# Patient Record
Sex: Female | Born: 1990 | Race: Black or African American | Hispanic: No | Marital: Married | Smoking: Never smoker
Health system: Southern US, Community
[De-identification: ages and names within clinical notes are randomized; demographics above are authoritative.]

## PROBLEM LIST (undated history)

## (undated) DIAGNOSIS — Z789 Other specified health status: Secondary | ICD-10-CM

## (undated) DIAGNOSIS — D696 Thrombocytopenia, unspecified: Secondary | ICD-10-CM

## (undated) DIAGNOSIS — O99119 Other diseases of the blood and blood-forming organs and certain disorders involving the immune mechanism complicating pregnancy, unspecified trimester: Secondary | ICD-10-CM

## (undated) DIAGNOSIS — K831 Obstruction of bile duct: Secondary | ICD-10-CM

## (undated) HISTORY — PX: NO PAST SURGERIES: SHX2092

## (undated) HISTORY — DX: Obstruction of bile duct: K83.1

## (undated) HISTORY — DX: Thrombocytopenia, unspecified: O99.119

## (undated) HISTORY — PX: WISDOM TOOTH EXTRACTION: SHX21

## (undated) HISTORY — DX: Thrombocytopenia, unspecified: D69.6

## (undated) HISTORY — DX: Other specified health status: Z78.9

---

## 2018-06-13 NOTE — L&D Delivery Note (Addendum)
OB/GYN Faculty Practice Delivery Note  Dorothy Olsen is a 28 y.o. G1P0 s/p induced vaginal delivery at [redacted]w[redacted]d. She was admitted for IOL 2/2 ICP.   ROM: 4h 110m with clear fluid GBS Status: negative Maximum Maternal Temperature: 98.8  Labor Progress: . After a short second stage, the patient delivered a female infant under epidural anesthesia at 1701 on 01/18/2019.  Delivery Date/Time: 01/18/2019 at 1707 hours Delivery: Called to room and patient was actively pushing. Head delivered ROA and restituted LOT. There was one nuchal, tight, which was reduced. Shoulder and body delivered in usual fashion. Infant with spontaneous cry, placed on mother's abdomen, dried and stimulated. Cord clamped x 2 after 1-minute delay, and cut by father of the baby. Cord blood drawn. Placenta delivered spontaneously with gentle cord traction via Duncan mechanism. Fundus firm with massage and Pitocin.   There was a 2nd degree perineal laceration which was repaired with 3-0 Rapide in the usual fashion.  Placenta: intact Complications: none Lacerations: 2nd degree, repaired with 3-0 Rapide in the usual fashion EBL: 115 Analgesia: epidural  Postpartum Planning [x]  message to sent to schedule follow-up  [x]  vaccines UTD  Infant: female, APGARS 70 & 4, [place], DO OB/GYN Fellow, Faculty Practice    I confirm that I have verified the information documented in the reperformed  student's note and that I have also personally reperformed the history, physical exam and all medical decision making activities of this service and have verified that all service and findings are accurately documented in this student's note.     [place], [place] 01/26/2019 10:41 AM

## 2018-07-02 ENCOUNTER — Encounter: Payer: Self-pay | Admitting: General Practice

## 2018-07-02 ENCOUNTER — Ambulatory Visit (INDEPENDENT_AMBULATORY_CARE_PROVIDER_SITE_OTHER): Payer: 59 | Admitting: *Deleted

## 2018-07-02 ENCOUNTER — Other Ambulatory Visit: Payer: Self-pay

## 2018-07-02 VITALS — BP 120/74 | HR 90 | Temp 98.7°F | Ht 66.0 in | Wt 170.6 lb

## 2018-07-02 DIAGNOSIS — Z3201 Encounter for pregnancy test, result positive: Secondary | ICD-10-CM | POA: Diagnosis not present

## 2018-07-02 DIAGNOSIS — Z3200 Encounter for pregnancy test, result unknown: Secondary | ICD-10-CM

## 2018-07-02 LAB — POCT URINE PREGNANCY: Preg Test, Ur: POSITIVE — AB

## 2018-07-02 NOTE — Progress Notes (Signed)
   Ms. Senters presents today for UPT. She has no unusual complaints and complains of nausea with vomiting for 15 days. Requesting medication for nausea and vomiting.  LMP:  05/03/2018    OBJECTIVE: Appears well, in no apparent distress.  OB History    Gravida  1   Para      Term      Preterm      AB      Living        SAB      TAB      Ectopic      Multiple      Live Births             Home UPT Result: Positive In-Office UPT result: Positive I have reviewed the patient's medical, obstetrical, social, and family histories, and medications.   ASSESSMENT: Positive pregnancy test  PLAN Prenatal care to be completed at: [facility]. Start PNV.

## 2018-07-18 ENCOUNTER — Other Ambulatory Visit: Payer: Self-pay

## 2018-07-18 ENCOUNTER — Ambulatory Visit (INDEPENDENT_AMBULATORY_CARE_PROVIDER_SITE_OTHER): Payer: 59 | Admitting: *Deleted

## 2018-07-18 DIAGNOSIS — Z3400 Encounter for supervision of normal first pregnancy, unspecified trimester: Secondary | ICD-10-CM | POA: Diagnosis not present

## 2018-07-18 LAB — POCT URINALYSIS DIPSTICK OB
Bilirubin, UA: NEGATIVE
Glucose, UA: NEGATIVE
Ketones, UA: NEGATIVE
Leukocytes, UA: NEGATIVE
Nitrite, UA: NEGATIVE
PH UA: 7 (ref 5.0–8.0)
RBC UA: NEGATIVE
Spec Grav, UA: 1.02 (ref 1.010–1.025)
Urobilinogen, UA: 0.2 E.U./dL

## 2018-07-18 NOTE — Patient Instructions (Addendum)
Genetic Screening Results Information: You are having genetic testing called [facility] today.  It will take approximately 2 weeks before the results are available.  To get your results, you need Internet access to a web browser to search [place]/MyChart (the direct app on your phone will not give you these results).  Then select Lab Scanned and click on the blue hyper link that says View Image to see your Panorama results.  You can also use the directions on the purple card given to look up your results directly on the [facility] website.   First Trimester of Pregnancy  The first trimester of pregnancy is from week 1 until the end of week 13 (months 1 through 3). During this time, your baby will begin to develop inside you. At 6-8 weeks, the eyes and face are formed, and the heartbeat can be seen on ultrasound. At the end of 12 weeks, all the baby's organs are formed. Prenatal care is all the medical care you receive before the birth of your baby. Make sure you get good prenatal care and follow all of your doctor's instructions. Follow these instructions at home: Medicines  Take over-the-counter and prescription medicines only as told by your doctor. Some medicines are safe and some medicines are not safe during pregnancy.  Take a prenatal vitamin that contains at least 600 micrograms (mcg) of folic acid.  If you have trouble pooping (constipation), take medicine that will make your stool soft (stool softener) if your doctor approves. Eating and drinking   Eat regular, healthy meals.  Your doctor will tell you the amount of weight gain that is right for you.  Avoid raw meat and uncooked cheese.  If you feel sick to your stomach (nauseous) or throw up (vomit): ? Eat 4 or 5 small meals a day instead of 3 large meals. ? Try eating a few soda crackers. ? Drink liquids between meals instead of during meals.  To prevent constipation: ? Eat foods that are high in fiber, like fresh fruits and  vegetables, whole grains, and beans. ? Drink enough fluids to keep your pee (urine) clear or pale yellow. Activity  Exercise only as told by your doctor. Stop exercising if you have cramps or pain in your lower belly (abdomen) or low back.  Do not exercise if it is too hot, too humid, or if you are in a place of great height (high altitude).  Try to avoid standing for long periods of time. Move your legs often if you must stand in one place for a long time.  Avoid heavy lifting.  Wear low-heeled shoes. Sit and stand up straight.  You can have sex unless your doctor tells you not to. Relieving pain and discomfort  Wear a good support bra if your breasts are sore.  Take warm water baths (sitz baths) to soothe pain or discomfort caused by hemorrhoids. Use hemorrhoid cream if your doctor says it is okay.  Rest with your legs raised if you have leg cramps or low back pain.  If you have puffy, bulging veins (varicose veins) in your legs: ? Wear support hose or compression stockings as told by your doctor. ? Raise (elevate) your feet for 15 minutes, 3-4 times a day. ? Limit salt in your food. Prenatal care  Schedule your prenatal visits by the twelfth week of pregnancy.  Write down your questions. Take them to your prenatal visits.  Keep all your prenatal visits as told by your doctor. This is important.  Safety  Wear your seat belt at all times when driving.  Make a list of emergency phone numbers. The list should include numbers for family, friends, the hospital, and police and fire departments. General instructions  Ask your doctor for a referral to a local prenatal class. Begin classes no later than at the start of month 6 of your pregnancy.  Ask for help if you need counseling or if you need help with nutrition. Your doctor can give you advice or tell you where to go for help.  Do not use hot tubs, steam rooms, or saunas.  Do not douche or use tampons or scented sanitary  pads.  Do not cross your legs for long periods of time.  Avoid all herbs and alcohol. Avoid drugs that are not approved by your doctor.  Do not use any tobacco products, including cigarettes, chewing tobacco, and electronic cigarettes. If you need help quitting, ask your doctor. You may get counseling or other support to help you quit.  Avoid cat litter boxes and soil used by cats. These carry germs that can cause birth defects in the baby and can cause a loss of your baby (miscarriage) or stillbirth.  Visit your dentist. At home, brush your teeth with a soft toothbrush. Be gentle when you floss. Contact a doctor if:  You are dizzy.  You have mild cramps or pressure in your lower belly.  You have a nagging pain in your belly area.  You continue to feel sick to your stomach, you throw up, or you have watery poop (diarrhea).  You have a bad smelling fluid coming from your vagina.  You have pain when you pee (urinate).  You have increased puffiness (swelling) in your face, hands, legs, or ankles. Get help right away if:  You have a fever.  You are leaking fluid from your vagina.  You have spotting or bleeding from your vagina.  You have very bad belly cramping or pain.  You gain or lose weight rapidly.  You throw up blood. It may look like coffee grounds.  You are around people who have [place] measles, fifth disease, or chickenpox.  You have a very bad headache.  You have shortness of breath.  You have any kind of trauma, such as from a fall or a car accident. Summary  The first trimester of pregnancy is from week 1 until the end of week 13 (months 1 through 3).  To take care of yourself and your unborn baby, you will need to eat healthy meals, take medicines only if your doctor tells you to do so, and do activities that are safe for you and your baby.  Keep all follow-up visits as told by your doctor. This is important as your doctor will have to ensure that your  baby is healthy and growing well. This information is not intended to replace advice given to you by your health care provider. Make sure you discuss any questions you have with your health care provider. Document Released: 11/16/2007 Document Revised: 06/07/2016 Document Reviewed: 06/07/2016 Elsevier Interactive Patient Education  2019 [employer].  Pregnancy and Sex Your sex life may change during pregnancy as well as after your newborn arrives. It is normal to have questions about sex during pregnancy. All women are affected differently by pregnancy hormones. You may notice an increase or decrease in your sexual drive throughout your pregnancy. Also, your partner's attitude and sexual drive may change. Share the information in this document with your partner.  Talk openly about how you feel about sex. When is it safe to have sex during pregnancy? Sex is generally considered safe throughout a normal low-risk pregnancy. Remember:  The fetus is protected by the uterus and the fluid-filled sac that surrounds the fetus (amniotic sac).  The cervix is closed or sealed during pregnancy.  The penis does not reach or harm the fetus during sex.  Sex and orgasms are not thought to cause miscarriages or early labor.  If you use lubricants, use a water-soluble product. What risk factors make it unsafe to have sex while pregnant? The following complications or risk factors may make it necessary to limit sexual activity:  You have a history of miscarriage or preterm labor.  You have bleeding, discharge, fluid leakage, or contractions.  Your placenta may be partially covering or completely covering the opening to the cervix (placenta previa).  Your cervix is weak and opens easily (incompetent cervix).  Your partner has an STD (sexually transmitted disease). Avoid sex with the infected person or use a condom to prevent infection to the fetus.  You are unsure of your partner's sexual history. Avoid  sex or use condoms.  You are having twins, triples, or other multiples. Your health care provider will help you determine whether sex during your pregnancy is safe. What practices are unsafe?  If you engage in oral sex, you should avoid having your partner blow air into your vagina. Although very rare, this can send a dangerous air bubble into your bloodstream.  Anal sex is generally safe during pregnancy, but there can be a risk of spreading bacteria from the rectum and aggravating any hemorrhoids. This information is not intended to replace advice given to you by your health care provider. Make sure you discuss any questions you have with your health care provider. Document Released: 11/17/2009 Document Revised: 01/26/2016 Document Reviewed: 11/19/2015 Elsevier Interactive Patient Education  2019 [employer]  Preterm Labor and Birth Information Pregnancy normally lasts 39-41 weeks. Preterm labor is when labor starts early. It starts before you have been pregnant for 37 whole weeks. What are the risk factors for preterm labor? Preterm labor is more likely to occur in women who:  Have an infection while pregnant.  Have a cervix that is short.  Have gone into preterm labor before.  Have had surgery on their cervix.  Are younger than age 29.  Are older than age 29.  Are African American.  Are pregnant with two or more babies.  Take street drugs while pregnant.  Smoke while pregnant.  Do not gain enough weight while pregnant.  Got pregnant right after another pregnancy. What are the symptoms of preterm labor? Symptoms of preterm labor include:  Cramps. The cramps may feel like the cramps some women get during their period. The cramps may happen with watery poop (diarrhea).  Pain in the belly (abdomen).  Pain in the lower back.  Regular contractions or tightening. It may feel like your belly is getting tighter.  Pressure in the lower belly that seems to get  stronger.  More fluid (discharge) leaking from the vagina. The fluid may be watery or bloody.  Water breaking. Why is it important to notice signs of preterm labor? Babies who are born early may not be fully developed. They have a higher chance for:  Long-term heart problems.  Long-term lung problems.  Trouble controlling body systems, like breathing.  Bleeding in the brain.  A condition called cerebral palsy.  Learning difficulties.  Death.  These risks are highest for babies who are born before 34 weeks of pregnancy. How is preterm labor treated? Treatment depends on:  How long you were pregnant.  Your condition.  The health of your baby. Treatment may involve:  Having a stitch (suture) placed in your cervix. When you give birth, your cervix opens so the baby can come out. The stitch keeps the cervix from opening too soon.  Staying at the hospital.  Taking or getting medicines, such as: ? Hormone medicines. ? Medicines to stop contractions. ? Medicines to help the baby's lungs develop. ? Medicines to prevent your baby from having cerebral palsy. What should I do if I am in preterm labor? If you think you are going into labor too soon, call your doctor right away. How can I prevent preterm labor?  Do not use any tobacco products. ? Examples of these are cigarettes, chewing tobacco, and e-cigarettes. ? If you need help quitting, ask your doctor.  Do not use street drugs.  Do not use any medicines unless you ask your doctor if they are safe for you.  Talk with your doctor before taking any herbal supplements.  Make sure you gain enough weight.  Watch for infection. If you think you might have an infection, get it checked right away.  If you have gone into preterm labor before, tell your doctor. This information is not intended to replace advice given to you by your health care provider. Make sure you discuss any questions you have with your health care  provider. Document Released: 08/26/2008 Document Revised: 11/10/2015 Document Reviewed: 10/21/2015 Elsevier Interactive Patient Education  2019 [employer]  Warning Signs During Pregnancy A pregnancy lasts about 40 weeks, starting from the first day of your last period until the baby is born. Pregnancy is divided into three phases called trimesters.  The first trimester refers to week 1 through week 13 of pregnancy.  The second trimester is the start of week 14 through the end of week 27.  The third trimester is the start of week 28 until you deliver your baby. During each trimester of pregnancy, certain signs and symptoms may indicate a problem. Talk with your health care provider about your current health and any medical conditions you have. Make sure you know the symptoms that you should watch for and report. How does this affect me?  Warning signs in the first trimester While some changes during the first trimester may be uncomfortable, most do not represent a serious problem. Let your health care provider know if you have any of the following warning signs in the first trimester:  You cannot eat or drink without vomiting, and this lasts for longer than a day.  You have vaginal bleeding or spotting along with menstrual-like cramping.  You have diarrhea for longer than a day.  You have a fever or other signs of infection, such as: ? Pain or burning when you urinate. ? Foul smelling or thick or yellowish vaginal discharge. Warning signs in the second trimester As your baby grows and changes during the second trimester, there are additional signs and symptoms that may indicate a problem. These include:  Signs and symptoms of infection, including a fever.  Signs or symptoms of a miscarriage or preterm labor, such as regular contractions, menstrual-like cramping, or lower abdominal pain.  Bloody or watery vaginal discharge or obvious vaginal bleeding.  Feeling like your heart is  pounding.  Having trouble breathing.  Nausea, vomiting, or diarrhea that lasts  for longer than a day.  Craving non-food items, such as clay, chalk, or dirt. This may be a sign of a very treatable medical condition called pica. Later in your second trimester, watch for signs and symptoms of a serious medical condition called preeclampsia.These include:  Changes in your vision.  A severe headache that does not go away.  Nausea and vomiting. It is also important to notice if your baby stops moving or moves less than usual during this time. Warning signs in the third trimester As you approach the third trimester, your baby is growing and your body is preparing for the birth of your baby. In your third trimester, be sure to let your health care provider know if:  You have signs and symptoms of infection, including a fever.  You have vaginal bleeding.  You notice that your baby is moving less than usual or is not moving.  You have nausea, vomiting, or diarrhea that lasts for longer than a day.  You have a severe headache that does not go away.  You have vision changes, including seeing spots or having blurry or double vision.  You have increased swelling in your hands or face. How does this affect my baby? Throughout your pregnancy, always report any of the warning signs of a problem to your health care provider. This can help prevent complications that may affect your baby, including:  Increased risk for premature birth.  Infection that may be transmitted to your baby.  Increased risk for stillbirth. Contact a health care provider if:  You have any of the warning signs of a problem for the current trimester of your pregnancy.  Any of the following apply to you during any trimester of pregnancy: ? You have strong emotions, such as sadness or anxiety, that interfere with work or personal relationships. ? You feel unsafe in your home and need help finding a safe place to  live. ? You are using tobacco products, alcohol, or drugs and you need help to stop. Get help right away if: You have signs or symptoms of labor before 37 weeks of pregnancy. These include:  Contractions that are 5 minutes or less apart, or that increase in frequency, intensity, or length.  Sudden, sharp abdominal pain or low back pain.  Uncontrolled gush or trickle of fluid from your vagina. Summary  A pregnancy lasts about 40 weeks, starting from the first day of your last period until the baby is born. Pregnancy is divided into three phases called trimesters. Each trimester has warning signs to watch for.  Always report any warning signs to your health care provider in order to prevent complications that may affect both you and your baby.  Talk with your health care provider about your current health and any medical conditions you have. Make sure you know the symptoms that you should watch for and report. This information is not intended to replace advice given to you by your health care provider. Make sure you discuss any questions you have with your health care provider. Document Released: 03/16/2017 Document Revised: 03/16/2017 Document Reviewed: 03/16/2017 Elsevier Interactive Patient Education  2019 [employer].

## 2018-07-18 NOTE — Progress Notes (Signed)
   PRENATAL INTAKE SUMMARY  Ms. Dorothy Olsen presents today New OB Nurse Interview.  OB History    Gravida  1   Para      Term      Preterm      AB      Living        SAB      TAB      Ectopic      Multiple      Live Births             I have reviewed the patient's medical, obstetrical, social, and family histories, medications, and available lab results.  SUBJECTIVE She has no unusual complaints.  OBJECTIVE Initial nurse interview for history or lab work (New OB).  EDD: 02/07/2019 GA: [id number] G1P0 FHT: 175  GENERAL APPEARANCE: alert, well appearing, in no apparent distress, oriented to person, place and time, well hydrated.   ASSESSMENT Normal pregnancy.  PLAN Prenatal care-[facility] Pnl/HIV  OB Urine Culture/Dip GC/CT/PAP at next visit with midwife HgbEval SMA/CF ([facility]) Panorama A1C Continue PNV Flu shot declined  Genetic Screening Results Information: You are having genetic testing called [facility] today.  It will take approximately 2 weeks before the results are available.  To get your results, you need Internet access to a web browser to search [place]/MyChart (the direct app on your phone will not give you these results).  Then select Lab Scanned and click on the blue hyper link that says View Image to see your [facility]ama results.  You can also use the directions on the purple card given to look up your results directly on the [facility] website.   Clovis PuMartin, Tamika L, RN

## 2018-07-19 ENCOUNTER — Encounter: Payer: Self-pay | Admitting: General Practice

## 2018-07-19 ENCOUNTER — Telehealth: Payer: Self-pay | Admitting: General Practice

## 2018-07-19 NOTE — Telephone Encounter (Signed)
Left message on VM in regards to [place] appt scheduled for 07/25/18 at 3:45pm.  Asked patient to give our office a call for any questions or concerns.

## 2018-07-20 LAB — URINE CULTURE, OB REFLEX

## 2018-07-20 LAB — CULTURE, OB URINE

## 2018-07-23 LAB — HEMOGLOBINOPATHY EVALUATION
Ferritin: 99 ng/mL (ref 15–150)
HGB SOLUBILITY: NEGATIVE
Hgb A2 Quant: 3 % (ref 1.8–3.2)
Hgb A: 97 % (ref 96.4–98.8)
Hgb C: 0 %
Hgb F Quant: 0 % (ref 0.0–2.0)
Hgb S: 0 %
Hgb Variant: 0 %

## 2018-07-23 LAB — OBSTETRIC PANEL, INCLUDING HIV
Antibody Screen: NEGATIVE
Basophils Absolute: 0 10*3/uL (ref 0.0–0.2)
Basos: 0 %
EOS (ABSOLUTE): 0.2 10*3/uL (ref 0.0–0.4)
Eos: 2 %
HEMATOCRIT: 33.3 % — AB (ref 34.0–46.6)
HIV Screen 4th Generation wRfx: NONREACTIVE
Hemoglobin: 10.9 g/dL — ABNORMAL LOW (ref 11.1–15.9)
Hepatitis B Surface Ag: NEGATIVE
Immature Grans (Abs): 0 10*3/uL (ref 0.0–0.1)
Immature Granulocytes: 0 %
LYMPHS: 36 %
Lymphocytes Absolute: 2.3 10*3/uL (ref 0.7–3.1)
MCH: 27.5 pg (ref 26.6–33.0)
MCHC: 32.7 g/dL (ref 31.5–35.7)
MCV: 84 fL (ref 79–97)
Monocytes Absolute: 0.4 10*3/uL (ref 0.1–0.9)
Monocytes: 7 %
Neutrophils Absolute: 3.4 10*3/uL (ref 1.4–7.0)
Neutrophils: 55 %
Platelets: 198 10*3/uL (ref 150–450)
RBC: 3.96 x10E6/uL (ref 3.77–5.28)
RDW: 13.2 % (ref 11.7–15.4)
RPR Ser Ql: NONREACTIVE
Rh Factor: POSITIVE
Rubella Antibodies, IGG: 2.33 index (ref >0.99)
WBC: 6.3 10*3/uL (ref 3.4–10.8)

## 2018-07-23 LAB — HEMOGLOBIN A1C
ESTIMATED AVERAGE GLUCOSE: 97 mg/dL
Hgb A1c MFr Bld: 5 % (ref 4.8–5.6)

## 2018-07-25 ENCOUNTER — Ambulatory Visit (HOSPITAL_COMMUNITY)
Admission: RE | Admit: 2018-07-25 | Discharge: 2018-07-25 | Disposition: A | Discharge status: Home / Self Care | Payer: 59 | Source: Ambulatory Visit | Attending: Obstetrics and Gynecology | Admitting: Obstetrics and Gynecology

## 2018-07-25 ENCOUNTER — Other Ambulatory Visit: Payer: Self-pay | Admitting: Obstetrics and Gynecology

## 2018-07-25 DIAGNOSIS — Z3400 Encounter for supervision of normal first pregnancy, unspecified trimester: Secondary | ICD-10-CM | POA: Insufficient documentation

## 2018-07-30 ENCOUNTER — Encounter: Payer: Self-pay | Admitting: General Practice

## 2018-08-02 ENCOUNTER — Encounter: Payer: 59 | Admitting: Obstetrics and Gynecology

## 2018-08-06 ENCOUNTER — Encounter: Payer: Self-pay | Admitting: General Practice

## 2018-08-16 ENCOUNTER — Other Ambulatory Visit: Payer: Self-pay

## 2018-08-16 ENCOUNTER — Ambulatory Visit (INDEPENDENT_AMBULATORY_CARE_PROVIDER_SITE_OTHER): Payer: 59 | Admitting: Obstetrics and Gynecology

## 2018-08-16 VITALS — BP 130/74 | HR 80 | Wt 169.6 lb

## 2018-08-16 DIAGNOSIS — N898 Other specified noninflammatory disorders of vagina: Secondary | ICD-10-CM | POA: Diagnosis not present

## 2018-08-16 DIAGNOSIS — Z124 Encounter for screening for malignant neoplasm of cervix: Secondary | ICD-10-CM | POA: Diagnosis not present

## 2018-08-16 DIAGNOSIS — Z3400 Encounter for supervision of normal first pregnancy, unspecified trimester: Secondary | ICD-10-CM

## 2018-08-16 DIAGNOSIS — Z3492 Encounter for supervision of normal pregnancy, unspecified, second trimester: Secondary | ICD-10-CM

## 2018-08-16 DIAGNOSIS — Z113 Encounter for screening for infections with a predominantly sexual mode of transmission: Secondary | ICD-10-CM

## 2018-08-16 NOTE — Progress Notes (Signed)
  Subjective:    Dorothy Olsen is being seen today for her first obstetrical visit.  This is a planned pregnancy. She is at [redacted]w[redacted]d gestation. Her obstetrical history is not significant. Relationship with FOB: spouse, living together. Patient does intend to breast feed. Pregnancy history fully reviewed.  Patient reports no complaints.  Review of Systems:   Review of Systems  Constitutional: Positive for appetite change (decreased, getting better). Negative for fatigue and fever.  HENT: Negative.   Eyes: Negative.  Negative for visual disturbance.  Respiratory: Negative.  Negative for shortness of breath.   Cardiovascular: Negative.   Gastrointestinal: Positive for nausea. Negative for abdominal distention, abdominal pain, constipation and diarrhea (occasional).  Endocrine: Negative.   Genitourinary: Negative.  Negative for vaginal bleeding, vaginal discharge and vaginal pain.  Musculoskeletal: Negative.   Allergic/Immunologic: Negative.   Neurological: Negative.  Negative for dizziness and light-headedness.  Hematological: Negative.   Psychiatric/Behavioral: Negative.     Objective:     BP 130/74   Pulse 80   Wt 76.9 kg   LMP 05/03/2018 (Exact Date)   BMI 27.37 kg/m  Physical Exam  Nursing note and vitals reviewed. Constitutional: She is oriented to person, place, and time. She appears well-developed and well-nourished. No distress.  HENT:  Head: Normocephalic.  Eyes: Pupils are equal, round, and reactive to light.  Neck: Normal range of motion.  Cardiovascular: Normal rate, regular rhythm and normal heart sounds.  Respiratory: Effort normal and breath sounds normal.  GI: Soft. She exhibits no distension. There is no abdominal tenderness.  Genitourinary:    Vulva, vagina and uterus normal.   Musculoskeletal: Normal range of motion.  Neurological: She is alert and oriented to person, place, and time.  Skin: Skin is warm and dry. She is not diaphoretic.  Psychiatric: She  has a normal mood and affect. Her behavior is normal. Judgment and thought content normal.    Maternal Exam:  Abdomen: Patient reports no abdominal tenderness. Fundal height is 15cm.    Introitus: Normal vulva. Normal vagina.    Fetal Exam Fetal Monitor Review: Mode: hand-held doppler probe.   Baseline rate: 163.       Assessment & Plan:  Pap smear for cervical cancer screening - Plan: Cytology - PAP( [place])  Supervision of normal first pregnancy, antepartum - Plan: Cytology - PAP( [place]), Cervicovaginal ancillary only( [place]), [place] MFM OB COMP + 14 WK  Initial labs reviewed, [place] test given to FOB for alpha-thalassemia testing. Prenatal vitamins being taken. Problem list reviewed and updated. AFP3 discussed: requested. Role of ultrasound in pregnancy discussed; fetal survey: requested. Amniocentesis discussed: not indicated. Follow up in 4 weeks. Reviewed the nature of [place] [facility] with multiple MDs and other Advanced Practice Providers was explained to patient; also emphasized that residents, students are part of our team. 50% of 40 min visit spent on counseling and coordination of care.    Bernerd Limbo, SNM 08/16/2018

## 2018-08-17 LAB — CERVICOVAGINAL ANCILLARY ONLY
Bacterial vaginitis: NEGATIVE
Candida vaginitis: NEGATIVE
Chlamydia: NEGATIVE
Neisseria Gonorrhea: NEGATIVE
Trichomonas: NEGATIVE

## 2018-08-18 ENCOUNTER — Encounter: Payer: Self-pay | Admitting: Obstetrics and Gynecology

## 2018-08-20 LAB — CYTOLOGY - PAP: Diagnosis: NEGATIVE

## 2018-09-06 ENCOUNTER — Encounter (HOSPITAL_COMMUNITY): Payer: Self-pay

## 2018-09-10 ENCOUNTER — Telehealth: Payer: Self-pay | Admitting: General Practice

## 2018-09-10 NOTE — Telephone Encounter (Signed)
Left message on VM in regards to appt on Thursday, 09/13/2018.  Asked patient to give our office a call.  Mychart message sent to patient.

## 2018-09-13 ENCOUNTER — Other Ambulatory Visit: Payer: Self-pay

## 2018-09-13 ENCOUNTER — Encounter: Payer: Self-pay | Admitting: Obstetrics and Gynecology

## 2018-09-13 ENCOUNTER — Ambulatory Visit (INDEPENDENT_AMBULATORY_CARE_PROVIDER_SITE_OTHER): Payer: 59 | Admitting: Obstetrics and Gynecology

## 2018-09-13 VITALS — BP 138/70 | Wt 168.2 lb

## 2018-09-13 DIAGNOSIS — Z3402 Encounter for supervision of normal first pregnancy, second trimester: Secondary | ICD-10-CM

## 2018-09-13 DIAGNOSIS — Z3400 Encounter for supervision of normal first pregnancy, unspecified trimester: Secondary | ICD-10-CM

## 2018-09-13 DIAGNOSIS — Z3A19 19 weeks gestation of pregnancy: Secondary | ICD-10-CM

## 2018-09-13 NOTE — Progress Notes (Signed)
   TELEHEALTH VIRTUAL OBSTETRICS VISIT ENCOUNTER NOTE  I connected with Dorothy Olsen on 09/13/18 at  4:10 PM EDT by telephone at home and verified that I am speaking with the correct person using two identifiers.   I discussed the limitations, risks, security and privacy concerns of performing an evaluation and management service by telephone and the availability of in person appointments. I also discussed with the patient that there may be a patient responsible charge related to this service. The patient expressed understanding and agreed to proceed.  Subjective:  Dorothy Olsen is a 28 y.o. G1P0 at [redacted]w[redacted]d being followed for ongoing prenatal care.  She is currently monitored for the following issues for this low-risk pregnancy and has Supervision of normal first pregnancy, antepartum on their problem list.  Patient reports headache. Reports fetal movement. Denies any contractions, bleeding or leaking of fluid.    VS taken with patient's own equipment: BP: 138/70 Wt: 168.2 Lbs  The following portions of the patient's history were reviewed and updated as appropriate: allergies, current medications, past family history, past medical history, past social history, past surgical history and problem list.   Objective:   General:  Alert, oriented and cooperative.   Mental Status: Normal mood and affect perceived. Normal judgment and thought content.  Rest of physical exam deferred due to type of encounter  Assessment and Plan:  Pregnancy: G1P0 at [redacted]w[redacted]d 1. Supervision of normal first pregnancy, antepartum - Anatomy U/S tomorrow 09/14/2018 -- will discuss results at [facility] - Anticipatory guidance for 2 hr GTT at [facility]  Preterm labor symptoms and general obstetric precautions including but not limited to vaginal bleeding, contractions, leaking of fluid and fetal movement were reviewed in detail with the patient.  I discussed the assessment and treatment plan with the patient. The patient was provided an  opportunity to ask questions and all were answered. The patient agreed with the plan and demonstrated an understanding of the instructions. The patient was advised to call back or seek an in-person office evaluation/go to [facility]U at [facility] for any urgent or concerning symptoms. Please refer to After Visit Summary for other counseling recommendations.   I provided 10 minutes of non-face-to-face time during this encounter.  Return in about 2 months (around 11/13/2018) for Return OB 2hr GTT.  Future Appointments  Date Time Provider Department Center  09/14/2018  1:45 PM [facility] [place] 2 [facility]    Raelyn Mora, CNM [facility] for [employer], [facility]

## 2018-09-14 ENCOUNTER — Other Ambulatory Visit: Payer: Self-pay

## 2018-09-14 ENCOUNTER — Ambulatory Visit (HOSPITAL_COMMUNITY)
Admission: RE | Admit: 2018-09-14 | Discharge: 2018-09-14 | Disposition: A | Discharge status: Home / Self Care | Payer: 59 | Source: Ambulatory Visit | Attending: Obstetrics and Gynecology | Admitting: Obstetrics and Gynecology

## 2018-09-14 ENCOUNTER — Other Ambulatory Visit (HOSPITAL_COMMUNITY): Payer: Self-pay | Admitting: *Deleted

## 2018-09-14 DIAGNOSIS — Z363 Encounter for antenatal screening for malformations: Secondary | ICD-10-CM | POA: Diagnosis not present

## 2018-09-14 DIAGNOSIS — Z3A19 19 weeks gestation of pregnancy: Secondary | ICD-10-CM

## 2018-09-14 DIAGNOSIS — Z148 Genetic carrier of other disease: Secondary | ICD-10-CM

## 2018-09-14 DIAGNOSIS — Z3400 Encounter for supervision of normal first pregnancy, unspecified trimester: Secondary | ICD-10-CM | POA: Diagnosis not present

## 2018-09-14 DIAGNOSIS — Z362 Encounter for other antenatal screening follow-up: Secondary | ICD-10-CM

## 2018-09-27 ENCOUNTER — Other Ambulatory Visit: Payer: Self-pay | Admitting: *Deleted

## 2018-09-27 DIAGNOSIS — O2240 Hemorrhoids in pregnancy, unspecified trimester: Secondary | ICD-10-CM

## 2018-09-27 DIAGNOSIS — O4690 Antepartum hemorrhage, unspecified, unspecified trimester: Secondary | ICD-10-CM

## 2018-09-27 MED ORDER — HYDROCORTISONE (PERIANAL) 2.5 % EX CREA: 1.0000 "application " | TOPICAL_CREAM | Freq: Two times a day (BID) | CUTANEOUS | 0 refills | Status: DC

## 2018-11-06 ENCOUNTER — Ambulatory Visit (HOSPITAL_COMMUNITY)
Admission: RE | Admit: 2018-11-06 | Discharge: 2018-11-06 | Disposition: A | Discharge status: Home / Self Care | Payer: 59 | Source: Ambulatory Visit | Attending: Obstetrics and Gynecology | Admitting: Obstetrics and Gynecology

## 2018-11-06 ENCOUNTER — Other Ambulatory Visit: Payer: Self-pay

## 2018-11-06 DIAGNOSIS — Z3A26 26 weeks gestation of pregnancy: Secondary | ICD-10-CM | POA: Diagnosis not present

## 2018-11-06 DIAGNOSIS — Z148 Genetic carrier of other disease: Secondary | ICD-10-CM

## 2018-11-06 DIAGNOSIS — Z362 Encounter for other antenatal screening follow-up: Secondary | ICD-10-CM | POA: Diagnosis not present

## 2018-11-08 ENCOUNTER — Ambulatory Visit (INDEPENDENT_AMBULATORY_CARE_PROVIDER_SITE_OTHER): Payer: 59 | Admitting: Obstetrics and Gynecology

## 2018-11-08 ENCOUNTER — Other Ambulatory Visit: Payer: Self-pay

## 2018-11-08 ENCOUNTER — Encounter: Payer: Self-pay | Admitting: Obstetrics and Gynecology

## 2018-11-08 ENCOUNTER — Other Ambulatory Visit: Payer: 59

## 2018-11-08 VITALS — BP 117/67 | HR 85 | Temp 97.9°F | Wt 186.6 lb

## 2018-11-08 DIAGNOSIS — O26892 Other specified pregnancy related conditions, second trimester: Secondary | ICD-10-CM

## 2018-11-08 DIAGNOSIS — Z3A27 27 weeks gestation of pregnancy: Secondary | ICD-10-CM

## 2018-11-08 DIAGNOSIS — Z23 Encounter for immunization: Secondary | ICD-10-CM | POA: Diagnosis not present

## 2018-11-08 DIAGNOSIS — L299 Pruritus, unspecified: Secondary | ICD-10-CM

## 2018-11-08 DIAGNOSIS — Z3400 Encounter for supervision of normal first pregnancy, unspecified trimester: Secondary | ICD-10-CM

## 2018-11-08 NOTE — Patient Instructions (Signed)
Fetal Movement Counts  Patient Name: ________________________________________________ Patient Due Date: ____________________  What is a fetal movement count?    A fetal movement count is the number of times that you feel your baby move during a certain amount of time. This may also be called a fetal kick count. A fetal movement count is recommended for every pregnant woman. You may be asked to start counting fetal movements as early as week 28 of your pregnancy.  Pay attention to when your baby is most active. You may notice your baby's sleep and wake cycles. You may also notice things that make your baby move more. You should do a fetal movement count:   When your baby is normally most active.   At the same time each day.  A good time to count movements is while you are resting, after having something to eat and drink.  How do I count fetal movements?  1. Find a quiet, comfortable area. Sit, or lie down on your side.  2. Write down the date, the start time and stop time, and the number of movements that you felt between those two times. Take this information with you to your health care visits.  3. For 2 hours, count kicks, flutters, swishes, rolls, and jabs. You should feel at least 10 movements during 2 hours.  4. You may stop counting after you have felt 10 movements.  5. If you do not feel 10 movements in 2 hours, have something to eat and drink. Then, keep resting and counting for 1 hour. If you feel at least 4 movements during that hour, you may stop counting.  Contact a health care provider if:   You feel fewer than 4 movements in 2 hours.   Your baby is not moving like he or she usually does.  Date: ____________ Start time: ____________ Stop time: ____________ Movements: ____________  Date: ____________ Start time: ____________ Stop time: ____________ Movements: ____________  Date: ____________ Start time: ____________ Stop time: ____________ Movements: ____________  Date: ____________ Start time:  ____________ Stop time: ____________ Movements: ____________  Date: ____________ Start time: ____________ Stop time: ____________ Movements: ____________  Date: ____________ Start time: ____________ Stop time: ____________ Movements: ____________  Date: ____________ Start time: ____________ Stop time: ____________ Movements: ____________  Date: ____________ Start time: ____________ Stop time: ____________ Movements: ____________  Date: ____________ Start time: ____________ Stop time: ____________ Movements: ____________  This information is not intended to replace advice given to you by your health care provider. Make sure you discuss any questions you have with your health care provider.  Document Released: 06/29/2006 Document Revised: 01/27/2016 Document Reviewed: 07/09/2015  Elsevier Interactive Patient Education  2019 Elsevier Inc[employer]    Iron-Rich Diet    Iron is a mineral that helps your body to produce hemoglobin. Hemoglobin is a protein in red blood cells that carries oxygen to your body's tissues. Eating too little iron may cause you to feel weak and tired, and it can increase your risk of infection. Iron is naturally found in many foods, and many foods have iron added to them (iron-fortified foods).  You may need to follow an iron-rich diet if you do not have enough iron in your body due to certain medical conditions. The amount of iron that you need each day depends on your age, your sex, and any medical conditions you have. Follow instructions from your health care provider or a diet and nutrition specialist (dietitian) about how much iron you should eat each day.    What are tips for following this plan?  Reading food labels   Check food labels to see how many milligrams (mg) of iron are in each serving.  Cooking   Cook foods in pots and pans that are made from iron.   Take these steps to make it easier for your body to absorb iron from certain foods:  ? Soak beans overnight before cooking.  ? Soak whole  grains overnight and drain them before using.  ? Ferment flours before baking, such as by using yeast in bread dough.  Meal planning   When you eat foods that contain iron, you should eat them with foods that are high in vitamin C. These include oranges, peppers, tomatoes, potatoes, and mango. Vitamin C helps your body to absorb iron.  General information   Take iron supplements only as told by your health care provider. An overdose of iron can be life-threatening. If you were prescribed iron supplements, take them with orange juice or a vitamin C supplement.   When you eat iron-fortified foods or take an iron supplement, you should also eat foods that naturally contain iron, such as meat, poultry, and fish. Eating naturally iron-rich foods helps your body to absorb the iron that is added to other foods or contained in a supplement.   Certain foods and drinks prevent your body from absorbing iron properly. Avoid eating these foods in the same meal as iron-rich foods or with iron supplements. These foods include:  ? Coffee, black tea, and red wine.  ? Milk, dairy products, and foods that are high in calcium.  ? Beans and soybeans.  ? Whole grains.  What foods should I eat?  Fruits  Prunes. Raisins.  Eat fruits high in vitamin C, such as oranges, grapefruits, and strawberries, alongside iron-rich foods.  Vegetables  Spinach (cooked). Green peas. Broccoli. Fermented vegetables.  Eat vegetables high in vitamin C, such as leafy greens, potatoes, bell peppers, and tomatoes, alongside iron-rich foods.  Grains  Iron-fortified breakfast cereal. Iron-fortified whole-wheat bread. Enriched rice. Sprouted grains.  Meats and other proteins  Beef liver. Oysters. Beef. Shrimp. Turkey. Chicken. Tuna. Sardines. Chickpeas. Nuts. Tofu. Pumpkin seeds.  Beverages  Tomato juice. Fresh orange juice. Prune juice. Hibiscus tea. Fortified instant breakfast shakes.  Sweets and desserts  Blackstrap molasses.  Seasonings and  condiments  Tahini. Fermented soy sauce.  Other foods  Wheat germ.  The items listed above may not be a complete list of recommended foods and beverages. Contact a dietitian for more information.  What foods should I avoid?  Grains  Whole grains. Bran cereal. Bran flour. Oats.  Meats and other proteins  Soybeans. Products made from soy protein. Black beans. Lentils. Mung beans. Split peas.  Dairy  Milk. Cream. Cheese. Yogurt. Cottage cheese.  Beverages  Coffee. Black tea. Red wine.  Sweets and desserts  Cocoa. Chocolate. Ice cream.  Other foods  Basil. Oregano. Large amounts of parsley.  The items listed above may not be a complete list of foods and beverages to avoid. Contact a dietitian for more information.  Summary   Iron is a mineral that helps your body to produce hemoglobin. Hemoglobin is a protein in red blood cells that carries oxygen to your body's tissues.   Iron is naturally found in many foods, and many foods have iron added to them (iron-fortified foods).   When you eat foods that contain iron, you should eat them with foods that are high in vitamin C. Vitamin C helps your   body to absorb iron.   Certain foods and drinks prevent your body from absorbing iron properly, such as whole grains and dairy products. You should avoid eating these foods in the same meal as iron-rich foods or with iron supplements.  This information is not intended to replace advice given to you by your health care provider. Make sure you discuss any questions you have with your health care provider.  Document Released: 01/11/2005 Document Revised: 04/25/2017 Document Reviewed: 04/25/2017  Elsevier Interactive Patient Education  2019 Elsevier Inc.

## 2018-11-08 NOTE — Progress Notes (Signed)
   PRENATAL VISIT NOTE  Subjective:  Dorothy Olsen is a 28 y.o. G1P0 at [redacted]w[redacted]d being seen today for ongoing prenatal care.  She is currently monitored for the following issues for this low-risk pregnancy and has Supervision of normal first pregnancy, antepartum on their problem list.  Patient reports some mild cramping yesterday after picking up niece and nephew a lot. She reports that her mom told her she "was doing too much." She denies any cramping today. She also reports that she stopped having N/V. She reports that she has been "very itchy all over lately." She has tried Benadryl and creams with no relief. Contractions: Not present. Vag. Bleeding: None.  Movement: Present. Denies leaking of fluid.   The following portions of the patient's history were reviewed and updated as appropriate: allergies, current medications, past family history, past medical history, past social history, past surgical history and problem list.   Objective:   Vitals:   11/08/18 0839  BP: 117/67  Pulse: 85  Temp: 97.9 F (36.6 C)  Weight: 186 lb 9.6 oz (84.6 kg)    Fetal Status: Fetal Heart Rate (bpm): 144 Fundal Height: 26 cm Movement: Present     General:  Alert, oriented and cooperative. Patient is in no acute distress.  Skin: Skin is warm and dry. No rash noted.   Cardiovascular: Normal heart rate noted  Respiratory: Normal respiratory effort, no problems with respiration noted  Abdomen: Soft, gravid, appropriate for gestational age.  Pain/Pressure: Present     Pelvic: Cervical exam deferred        Extremities: Normal range of motion.  Edema: None  Mental Status: Normal mood and affect. Normal behavior. Normal judgment and thought content.   Assessment and Plan:  Pregnancy: G1P0 at [redacted]w[redacted]d 1. Supervision of normal first pregnancy, antepartum - Advised to limit lifting to 25 lbs or less and if picking up children, use good body mechanics  - Glucose Tolerance, 2 Hours w/1 Hour - RPR - CBC - HIV  Antibody (routine testing w rflx) - Tdap vaccine greater than or equal to 7yo IM - Information provided on FKC & iron-rich diet  - Reviewed optimized OB schedule until offices are allowed to phase in more patients  2. Need for tetanus, diphtheria, and acellular pertussis (Tdap) vaccine - Tdap vaccine greater than or equal to 7yo IM  3. Itchy skin  - Bile acids, total  Preterm labor symptoms and general obstetric precautions including but not limited to vaginal bleeding, contractions, leaking of fluid and fetal movement were reviewed in detail with the patient. Please refer to After Visit Summary for other counseling recommendations.   Return in about 5 weeks (around 12/13/2018) for Return OB - My Chart video.  Future Appointments  Date Time Provider Department Center  12/13/2018  1:30 PM Raelyn Mora, CNM [facility] None  01/10/2019  1:40 PM Raelyn Mora, CNM [facility] None    Raelyn Mora, [place]

## 2018-11-09 LAB — CBC
Hematocrit: 34.2 % (ref 34.0–46.6)
Hemoglobin: 10.9 g/dL — ABNORMAL LOW (ref 11.1–15.9)
MCH: 28.1 pg (ref 26.6–33.0)
MCHC: 31.9 g/dL (ref 31.5–35.7)
MCV: 88 fL (ref 79–97)
Platelets: 144 10*3/uL — ABNORMAL LOW (ref 150–450)
RBC: 3.88 x10E6/uL (ref 3.77–5.28)
RDW: 13.5 % (ref 11.7–15.4)
WBC: 7.4 10*3/uL (ref 3.4–10.8)

## 2018-11-09 LAB — GLUCOSE TOLERANCE, 2 HOURS W/ 1HR
Glucose, 1 hour: 71 mg/dL (ref 65–179)
Glucose, 2 hour: 77 mg/dL (ref 65–152)
Glucose, Fasting: 72 mg/dL (ref 65–91)

## 2018-11-09 LAB — HIV ANTIBODY (ROUTINE TESTING W REFLEX): HIV Screen 4th Generation wRfx: NONREACTIVE

## 2018-11-09 LAB — RPR: RPR Ser Ql: NONREACTIVE

## 2018-11-10 LAB — BILE ACIDS, TOTAL: Bile Acids Total: 10.2 umol/L (ref 0.0–10.0)

## 2018-11-12 ENCOUNTER — Other Ambulatory Visit: Payer: Self-pay | Admitting: Obstetrics and Gynecology

## 2018-11-12 ENCOUNTER — Encounter: Payer: Self-pay | Admitting: Obstetrics and Gynecology

## 2018-11-12 ENCOUNTER — Telehealth: Payer: Self-pay | Admitting: Obstetrics and Gynecology

## 2018-11-12 ENCOUNTER — Telehealth: Payer: Self-pay | Admitting: General Practice

## 2018-11-12 DIAGNOSIS — K831 Obstruction of bile duct: Secondary | ICD-10-CM | POA: Insufficient documentation

## 2018-11-12 DIAGNOSIS — O26619 Liver and biliary tract disorders in pregnancy, unspecified trimester: Secondary | ICD-10-CM

## 2018-11-12 DIAGNOSIS — D696 Thrombocytopenia, unspecified: Secondary | ICD-10-CM | POA: Insufficient documentation

## 2018-11-12 MED ORDER — URSODIOL 300 MG PO CAPS: 300.0000 mg | ORAL_CAPSULE | Freq: Two times a day (BID) | ORAL | 3 refills | Status: DC

## 2018-11-12 NOTE — Progress Notes (Signed)
TC to notify patient of dx of ICP and need to have RX for Actigall 300 mg BID, weekly BPP, monthly U/S, and delivery by 37 wks. All questions answered. Advised to send any questions that may arise later through My Chart. Patient verbalized an understanding of the plan of care and agrees.   Raelyn Mora, CNM   11/12/2018 10:05 AM

## 2018-11-12 NOTE — Telephone Encounter (Signed)
No answer. LVM to call me today at [phone] Raelyn Mora, CNM

## 2018-11-12 NOTE — Progress Notes (Signed)
BPP ordered per [facility] Antenatal Testing Guidelines

## 2018-11-12 NOTE — Telephone Encounter (Signed)
Patient aware of BPP appt at [facility] on 11/14/2018 at 2:45pm and verbalized understanding.

## 2018-11-13 LAB — COMPREHENSIVE METABOLIC PANEL
ALT: 31 IU/L (ref 0–32)
AST: 26 IU/L (ref 0–40)
Albumin/Globulin Ratio: 1.4 (ref 1.2–2.2)
Albumin: 3.4 g/dL — ABNORMAL LOW (ref 3.9–5.0)
Alkaline Phosphatase: 86 IU/L (ref 39–117)
BUN/Creatinine Ratio: 10 (ref 9–23)
BUN: 5 mg/dL — ABNORMAL LOW (ref 6–20)
Bilirubin Total: <0.2 mg/dL (ref 0.0–1.2)
CO2: 19 mmol/L — ABNORMAL LOW (ref 20–29)
Calcium: 9.1 mg/dL (ref 8.7–10.2)
Chloride: 105 mmol/L (ref 96–106)
Creatinine, Ser: 0.49 mg/dL — ABNORMAL LOW (ref 0.57–1.00)
GFR calc Af Amer: 153 mL/min/{1.73_m2} (ref >59)
GFR calc non Af Amer: 133 mL/min/{1.73_m2} (ref >59)
Globulin, Total: 2.4 g/dL (ref 1.5–4.5)
Glucose: 76 mg/dL (ref 65–99)
Potassium: 5.1 mmol/L (ref 3.5–5.2)
Sodium: 139 mmol/L (ref 134–144)
Total Protein: 5.8 g/dL — ABNORMAL LOW (ref 6.0–8.5)

## 2018-11-13 LAB — SPECIMEN STATUS REPORT

## 2018-11-14 ENCOUNTER — Other Ambulatory Visit (HOSPITAL_COMMUNITY): Payer: Self-pay | Admitting: *Deleted

## 2018-11-14 ENCOUNTER — Other Ambulatory Visit: Payer: Self-pay

## 2018-11-14 ENCOUNTER — Ambulatory Visit (HOSPITAL_COMMUNITY): Payer: 59 | Admitting: *Deleted

## 2018-11-14 ENCOUNTER — Ambulatory Visit (HOSPITAL_COMMUNITY)
Admission: RE | Admit: 2018-11-14 | Discharge: 2018-11-14 | Disposition: A | Discharge status: Home / Self Care | Payer: 59 | Source: Ambulatory Visit | Attending: Obstetrics and Gynecology | Admitting: Obstetrics and Gynecology

## 2018-11-14 ENCOUNTER — Encounter (HOSPITAL_COMMUNITY): Payer: Self-pay

## 2018-11-14 VITALS — BP 123/64 | HR 72 | Temp 98.6°F

## 2018-11-14 DIAGNOSIS — D696 Thrombocytopenia, unspecified: Secondary | ICD-10-CM

## 2018-11-14 DIAGNOSIS — O99119 Other diseases of the blood and blood-forming organs and certain disorders involving the immune mechanism complicating pregnancy, unspecified trimester: Secondary | ICD-10-CM

## 2018-11-14 DIAGNOSIS — K831 Obstruction of bile duct: Secondary | ICD-10-CM | POA: Insufficient documentation

## 2018-11-14 DIAGNOSIS — Z3400 Encounter for supervision of normal first pregnancy, unspecified trimester: Secondary | ICD-10-CM

## 2018-11-14 DIAGNOSIS — O26619 Liver and biliary tract disorders in pregnancy, unspecified trimester: Secondary | ICD-10-CM | POA: Insufficient documentation

## 2018-11-15 ENCOUNTER — Encounter: Payer: Self-pay | Admitting: General Practice

## 2018-11-21 ENCOUNTER — Ambulatory Visit (HOSPITAL_COMMUNITY): Payer: 59 | Admitting: *Deleted

## 2018-11-21 ENCOUNTER — Other Ambulatory Visit: Payer: Self-pay

## 2018-11-21 ENCOUNTER — Ambulatory Visit (HOSPITAL_COMMUNITY)
Admission: RE | Admit: 2018-11-21 | Discharge: 2018-11-21 | Disposition: A | Discharge status: Home / Self Care | Payer: 59 | Source: Ambulatory Visit | Attending: Obstetrics and Gynecology | Admitting: Obstetrics and Gynecology

## 2018-11-21 ENCOUNTER — Encounter (HOSPITAL_COMMUNITY): Payer: Self-pay

## 2018-11-21 VITALS — BP 120/64 | HR 81 | Temp 98.7°F

## 2018-11-21 DIAGNOSIS — K831 Obstruction of bile duct: Secondary | ICD-10-CM | POA: Diagnosis not present

## 2018-11-21 DIAGNOSIS — D696 Thrombocytopenia, unspecified: Secondary | ICD-10-CM | POA: Insufficient documentation

## 2018-11-21 DIAGNOSIS — Z3400 Encounter for supervision of normal first pregnancy, unspecified trimester: Secondary | ICD-10-CM

## 2018-11-21 DIAGNOSIS — O99119 Other diseases of the blood and blood-forming organs and certain disorders involving the immune mechanism complicating pregnancy, unspecified trimester: Secondary | ICD-10-CM | POA: Diagnosis present

## 2018-11-21 DIAGNOSIS — O26613 Liver and biliary tract disorders in pregnancy, third trimester: Secondary | ICD-10-CM

## 2018-11-21 DIAGNOSIS — Z3A28 28 weeks gestation of pregnancy: Secondary | ICD-10-CM

## 2018-11-21 DIAGNOSIS — Z148 Genetic carrier of other disease: Secondary | ICD-10-CM

## 2018-11-28 ENCOUNTER — Encounter (HOSPITAL_COMMUNITY): Payer: Self-pay

## 2018-11-28 ENCOUNTER — Ambulatory Visit (HOSPITAL_COMMUNITY): Payer: 59 | Admitting: *Deleted

## 2018-11-28 ENCOUNTER — Ambulatory Visit (HOSPITAL_COMMUNITY)
Admission: RE | Admit: 2018-11-28 | Discharge: 2018-11-28 | Disposition: A | Discharge status: Home / Self Care | Payer: 59 | Source: Ambulatory Visit | Attending: Obstetrics and Gynecology | Admitting: Obstetrics and Gynecology

## 2018-11-28 ENCOUNTER — Other Ambulatory Visit: Payer: Self-pay

## 2018-11-28 DIAGNOSIS — K831 Obstruction of bile duct: Secondary | ICD-10-CM | POA: Diagnosis not present

## 2018-11-28 DIAGNOSIS — Z3400 Encounter for supervision of normal first pregnancy, unspecified trimester: Secondary | ICD-10-CM | POA: Diagnosis present

## 2018-11-28 DIAGNOSIS — O26613 Liver and biliary tract disorders in pregnancy, third trimester: Secondary | ICD-10-CM

## 2018-11-28 DIAGNOSIS — O99119 Other diseases of the blood and blood-forming organs and certain disorders involving the immune mechanism complicating pregnancy, unspecified trimester: Secondary | ICD-10-CM | POA: Diagnosis present

## 2018-11-28 DIAGNOSIS — D696 Thrombocytopenia, unspecified: Secondary | ICD-10-CM | POA: Diagnosis present

## 2018-11-28 DIAGNOSIS — Z148 Genetic carrier of other disease: Secondary | ICD-10-CM

## 2018-11-28 DIAGNOSIS — Z3A29 29 weeks gestation of pregnancy: Secondary | ICD-10-CM

## 2018-12-05 ENCOUNTER — Ambulatory Visit (HOSPITAL_COMMUNITY)
Admission: RE | Admit: 2018-12-05 | Discharge: 2018-12-05 | Disposition: A | Discharge status: Home / Self Care | Payer: 59 | Source: Ambulatory Visit | Attending: Obstetrics and Gynecology | Admitting: Obstetrics and Gynecology

## 2018-12-05 ENCOUNTER — Ambulatory Visit (HOSPITAL_COMMUNITY): Payer: 59 | Admitting: *Deleted

## 2018-12-05 ENCOUNTER — Encounter (HOSPITAL_COMMUNITY): Payer: Self-pay

## 2018-12-05 ENCOUNTER — Other Ambulatory Visit: Payer: Self-pay

## 2018-12-05 DIAGNOSIS — K831 Obstruction of bile duct: Secondary | ICD-10-CM

## 2018-12-05 DIAGNOSIS — Z3A30 30 weeks gestation of pregnancy: Secondary | ICD-10-CM

## 2018-12-05 DIAGNOSIS — Z3400 Encounter for supervision of normal first pregnancy, unspecified trimester: Secondary | ICD-10-CM | POA: Insufficient documentation

## 2018-12-05 DIAGNOSIS — O26613 Liver and biliary tract disorders in pregnancy, third trimester: Secondary | ICD-10-CM | POA: Diagnosis not present

## 2018-12-05 DIAGNOSIS — D696 Thrombocytopenia, unspecified: Secondary | ICD-10-CM | POA: Diagnosis not present

## 2018-12-05 DIAGNOSIS — O99113 Other diseases of the blood and blood-forming organs and certain disorders involving the immune mechanism complicating pregnancy, third trimester: Secondary | ICD-10-CM

## 2018-12-05 DIAGNOSIS — O99119 Other diseases of the blood and blood-forming organs and certain disorders involving the immune mechanism complicating pregnancy, unspecified trimester: Secondary | ICD-10-CM | POA: Diagnosis not present

## 2018-12-05 DIAGNOSIS — Z148 Genetic carrier of other disease: Secondary | ICD-10-CM

## 2018-12-12 ENCOUNTER — Ambulatory Visit (HOSPITAL_COMMUNITY)
Admission: RE | Admit: 2018-12-12 | Discharge: 2018-12-12 | Disposition: A | Discharge status: Home / Self Care | Payer: 59 | Source: Ambulatory Visit | Attending: Obstetrics and Gynecology | Admitting: Obstetrics and Gynecology

## 2018-12-12 ENCOUNTER — Ambulatory Visit (HOSPITAL_COMMUNITY): Payer: 59 | Admitting: *Deleted

## 2018-12-12 ENCOUNTER — Other Ambulatory Visit: Payer: Self-pay

## 2018-12-12 ENCOUNTER — Encounter (HOSPITAL_COMMUNITY): Payer: Self-pay

## 2018-12-12 DIAGNOSIS — Z148 Genetic carrier of other disease: Secondary | ICD-10-CM

## 2018-12-12 DIAGNOSIS — D696 Thrombocytopenia, unspecified: Secondary | ICD-10-CM

## 2018-12-12 DIAGNOSIS — Z3400 Encounter for supervision of normal first pregnancy, unspecified trimester: Secondary | ICD-10-CM | POA: Diagnosis present

## 2018-12-12 DIAGNOSIS — K831 Obstruction of bile duct: Secondary | ICD-10-CM | POA: Diagnosis not present

## 2018-12-12 DIAGNOSIS — O99119 Other diseases of the blood and blood-forming organs and certain disorders involving the immune mechanism complicating pregnancy, unspecified trimester: Secondary | ICD-10-CM

## 2018-12-12 DIAGNOSIS — Z362 Encounter for other antenatal screening follow-up: Secondary | ICD-10-CM

## 2018-12-12 DIAGNOSIS — O26613 Liver and biliary tract disorders in pregnancy, third trimester: Secondary | ICD-10-CM | POA: Diagnosis not present

## 2018-12-12 DIAGNOSIS — Z3A31 31 weeks gestation of pregnancy: Secondary | ICD-10-CM

## 2018-12-12 DIAGNOSIS — O99113 Other diseases of the blood and blood-forming organs and certain disorders involving the immune mechanism complicating pregnancy, third trimester: Secondary | ICD-10-CM

## 2018-12-13 ENCOUNTER — Other Ambulatory Visit (HOSPITAL_COMMUNITY): Payer: Self-pay | Admitting: *Deleted

## 2018-12-13 ENCOUNTER — Encounter: Payer: Self-pay | Admitting: Obstetrics and Gynecology

## 2018-12-13 ENCOUNTER — Telehealth (INDEPENDENT_AMBULATORY_CARE_PROVIDER_SITE_OTHER): Payer: 59 | Admitting: Obstetrics and Gynecology

## 2018-12-13 VITALS — BP 119/71 | HR 98 | Wt 186.9 lb

## 2018-12-13 DIAGNOSIS — Z3A32 32 weeks gestation of pregnancy: Secondary | ICD-10-CM

## 2018-12-13 DIAGNOSIS — O26613 Liver and biliary tract disorders in pregnancy, third trimester: Secondary | ICD-10-CM

## 2018-12-13 DIAGNOSIS — O26643 Intrahepatic cholestasis of pregnancy, third trimester: Secondary | ICD-10-CM

## 2018-12-13 DIAGNOSIS — O26649 Intrahepatic cholestasis of pregnancy, unspecified trimester: Secondary | ICD-10-CM

## 2018-12-13 DIAGNOSIS — O0993 Supervision of high risk pregnancy, unspecified, third trimester: Secondary | ICD-10-CM

## 2018-12-13 DIAGNOSIS — K831 Obstruction of bile duct: Secondary | ICD-10-CM

## 2018-12-13 NOTE — Progress Notes (Signed)
MY CHART VIDEO VIRTUAL OBSTETRICS VISIT ENCOUNTER NOTE  I connected with Dorothy Olsen on 12/13/18 at  1:30 PM EDT by My Chart video at home and verified that I am speaking with the correct person using two identifiers. Her spouse, Dorothy Olsen, was also present for the entire visit with the patient's consent.   I discussed the limitations, risks, security and privacy concerns of performing an evaluation and management service by My Chart video and the availability of in person appointments. I also discussed with the patient that there may be a patient responsible charge related to this service. The patient expressed understanding and agreed to proceed.  Subjective:  Dorothy Olsen is a 28 y.o. G1P0 at 5739w0d being followed for ongoing prenatal care.  She is currently monitored for the following issues for this high-risk pregnancy and has Supervision of normal first pregnancy, antepartum; Cholestasis of pregnancy, antepartum; and Gestational thrombocytopenia (HCC) on their problem list.  Patient reports the itching she experienced with ICP is just starting to get better in the past week. she reports taking her Actigall every day. Reports fetal movement. Denies any contractions, bleeding or leaking of fluid.   The following portions of the patient's history were reviewed and updated as appropriate: allergies, current medications, past family history, past medical history, past social history, past surgical history and problem list.   Objective:   General:  Alert, oriented and cooperative.   Mental Status: Normal mood and affect perceived. Normal judgment and thought content.  Rest of physical exam deferred due to type of encounter  Assessment and Plan:  Pregnancy: G1P0 at 4239w0d 1. Cholestasis of pregnancy, antepartum  - Continue taking Actigall as previously prescribed - Information provided on ICP via My Chart - Continue wkly BPPs with [facility] -- next on 12/19/2018 - Delivery at 37 wks -- will get  scheduled at [facility] (desire IOL on 01/17/19 which is spouse's B-day)  2. Supervision of high risk pregnancy in third trimester  - Anticipatory guidance for GBS and cx check at [facility] - Extensive explanation of IOL processes - Information provided on IOL    Preterm labor symptoms and general obstetric precautions including but not limited to vaginal bleeding, contractions, leaking of fluid and fetal movement were reviewed in detail with the patient.  I discussed the assessment and treatment plan with the patient. The patient was provided an opportunity to ask questions and all were answered. The patient agreed with the plan and demonstrated an understanding of the instructions. The patient was advised to call back or seek an in-person office evaluation/go to [facility]U at [facility] for any urgent or concerning symptoms. Please refer to After Visit Summary for other counseling recommendations.   I provided 10 minutes of non-face-to-face time during this encounter. There was 5 minutes of chart review time spent prior to this encounter. Total time spent = 15 minutes.   Return in about 3 weeks (around 01/03/2019) for Return OB w/GBS.  Future Appointments  Date Time Provider Department Center  12/19/2018 12:30 PM [facility] NURSE [facility]  12/19/2018 12:30 PM [facility] US 1 [facility]  12/26/2018  3:30 PM [facility] NURSE [facility]  12/26/2018  3:30 PM [facility] US 1 [facility]-US  01/02/2019  3:30 PM [facility] NURSE WH-[facility]-US  01/02/2019  3:30 PM [facility] US 1 [facility]US  01/03/2019  1:30 PM Raelyn Moraawson, Brecken Dewoody, CNM [facility] None  01/09/2019  3:30 PM [facility] NURSE WH-[facility]  01/09/2019  3:30 PM [facility] US 1 [facility]  Laury Deep, [place] [facility] [place]

## 2018-12-13 NOTE — Patient Instructions (Addendum)
Labor Induction  Overview Labor induction - also known as inducing labor - is the stimulation of uterine contractions during pregnancy before labor begins on its own to achieve a vaginal birth. A health care provider might recommend labor induction for various reasons, primarily when there's concern for a mother's health or a baby's health. One of the most important factors in predicting the likelihood of a successful labor induction is how soft and distended your cervix is (cervical ripening). The benefits of labor induction typically outweigh the risks. If you're pregnant, understanding why and how labor induction is done can help you prepare. Why it's done To determine if labor induction is necessary, your health care provider will evaluate several factors, including your health, your baby's health, your baby's gestational age, weight and size, your baby's position in the uterus, and the status of your cervix. Reasons for labor induction include: . Postterm pregnancy. You're approaching two weeks beyond your due date, and labor hasn't started naturally.  . Premature rupture of membranes. Your water has broken, but labor hasn't begun.  Marland Kitchen. Chorioamnionitis. You have an infection in your uterus.  . Fetal growth restriction. The estimated weight of your baby is less than 10 percent of what is expected for the gestational age.  . Oligohydramnios. There's not enough amniotic fluid surrounding the baby.  . Gestational diabetes. You have diabetes that develops during pregnancy.  . High blood pressure disorders of pregnancy. You have a pregnancy complication characterized by high blood pressure and signs of damage to another organ system (preeclampsia), high blood pressure that was present before pregnancy or that occurs before 20 weeks of pregnancy (chronic high blood pressure), or high blood pressure that develops after 20 weeks of pregnancy (gestational hypertension).  . Placental abruption. Your placenta  peels away from the inner wall of the uterus before delivery - either partially or completely.  . Certain medical conditions. You have a medical condition such as intrahepatic cholestasis of pregnancy, kidney disease or obesity. Elective labor induction is the initiation of labor for convenience in a person with a term pregnancy who doesn't medically need the intervention. Elective labor inductions might be appropriate in some instances. For example, if you live far from the hospital or birthing center or you have a history of rapid deliveries, a scheduled induction might help you avoid an unattended delivery. In such cases, your health care provider will confirm that your baby's gestational age is at least 3839 weeks or older before induction to reduce the risk of health problems for your baby. Risks Labor induction carries various risks, including: . Failed induction. About 75 percent of first-time mothers who are induced will have a successful vaginal delivery. This means that about 25 percent of these women, who often start with an unripened cervix, might need a C-section. Your health care provider will discuss with you the possibility of a need for a C-section.  . Low heart rate. The medications used to induce labor - oxytocin or a prostaglandin - might cause abnormal or excessive contractions, which can diminish your baby's oxygen supply and lower your baby's heart rate.  . Infection. Some methods of labor induction, such as rupturing your membranes, might increase the risk of infection for both mother and baby. Prolonged membrane rupture increases the risk of an infection.  Marland Kitchen. Uterine rupture. This is a rare but serious complication in which your uterus tears open along the scar line from a prior C-section or major uterine surgery. Very rarely, uterine rupture can  also occur in women who had never had previous uterine surgery. An emergency C-section is needed to prevent life-threatening complications.  Your uterus might need to be removed.  . Bleeding after delivery. Labor induction increases the risk that your uterine muscles won't properly contract after you give birth (uterine atony), which can lead to serious bleeding after delivery. Labor induction isn't appropriate for everyone. Labor induction might not be an option if: . You've had a prior C-section with a classical incision or major uterine surgery  . The placenta is blocking your cervix (placenta previa)  . Your baby is lying buttocks first (breech) or sideways (transverse lie)  . You have an active genital herpes infection  . The umbilical cord slips into your vagina before delivery (umbilical cord prolapse) If you've had a prior C-section and have labor induced, your health care provider will avoid certain medications to reduce the risk of uterine rupture. How you prepare Labor induction is done in a hospital or birthing center, where you and your baby can be monitored and labor and delivery services are readily available. However, some steps might be taken prior to admission. What you can expect During the procedure There are various methods for inducing labor. Depending on the circumstances, your health care provider might: . Ripen your cervix. Sometimes synthetic prostaglandins, which are typically placed inside the vagina, are used to thin or soften (ripen) the cervix. After prostaglandin use, your contractions and your baby's heart rate will be monitored. In other cases, a small tube (catheter) with an inflatable balloon on the end is inserted into the cervix. Filling the balloon with saline and resting it against the inside of the cervix helps ripen the cervix.  . Rupture the amniotic sac. With this technique, also known as an amniotomy, your health care provider makes a small opening in the amniotic sac with a plastic hook. You might feel a warm gush of fluid when the sac opens, also known as your water breaking. An amniotomy is  done only if the cervix is partially dilated and thinned and the baby's head is deep in the pelvis. Your baby's heart rate will be monitored before and after the procedure. Your health care provider will examine the amniotic fluid for traces of fecal waste (meconium).  . Use an intravenous medication. In the hospital, your health care provider might intravenously give you a synthetic version of oxytocin (Pitocin) - a hormone that causes the uterus to contract. Oxytocin is more effective at speeding up (augmenting) labor that has already begun than it is as a cervical ripening agent. Your contractions and your baby's heart rate will be continuously monitored. Keep in mind that your health care provider might also use a combination of these methods to induce labor. How long it takes for labor to start depends on how ripe your cervix is when your induction starts, the induction techniques used and how your body responds to them. If your cervix needs time to ripen, it might take days before labor begins. If you simply need a little push, you might be holding your baby in your arms in a matter of hours. After the procedure In most cases, labor induction leads to a successful vaginal birth. If labor induction fails, you might need to try another induction or have a C-section. If you have a successful vaginal delivery after induction, there might be no implications for future pregnancies. If the induction leads to a C-section, your health care provider can help you decide whether  to attempt a vaginal delivery with a subsequent pregnancy or to schedule a repeat C-section.   Cholestasis of Pregnancy  Cholestasis refers to any condition that causes the flow of digestive fluid (bile) produced by the liver to slow down or stop. Cholestasis of pregnancy is most common toward the end of pregnancy (thirdtrimester), but it can occur any time during pregnancy. The condition often goes away soon after giving  birth. Cholestasis may be uncomfortable, but it is usually harmless to you. However, it can be harmful to your baby. Cholestasis may increase the risk of:  Your baby being born too early (preterm delivery).  Your baby having a slow heart rate and lack of oxygen during delivery (fetal distress).  Losing your baby before delivery (stillbirth). What are the causes? The exact cause of this condition is not known, but it may be related to:  Pregnancy hormones. The gallbladder normally holds bile until you need it to help digest fat in your diet. Pregnancy hormones may cause the flow of bile to slow down and back up into your liver. Bile may then get into your bloodstream and cause cholestasis symptoms.  Changes in your genes (genetic mutations). Specifically, genes that affect how the liver releases bile. What increases the risk? You are more likely to develop this condition if:  You had cholestasis during a previous pregnancy.  You have a family history of cholestasis.  You have liver problems.  You are having multiple babies, such as twins or triplets. What are the signs or symptoms? The most common symptom of this condition is intense itching (pruritus), especially on the palms of your hands and soles of your feet. The itching can spread to the rest of your body and is often worse at night. You will not usually have a rash. Other symptoms may include:  Feeling tired.  Pain in your upper right abdomen.  Dark-colored urine.  Light-colored stools.  Poor appetite.  Yellowish discoloration of your skin and the whites of your eyes (jaundice). How is this diagnosed? This condition is diagnosed based on:  Your medical history.  A physical exam.  Blood tests. If you have an inherited risk for developing this condition, you may also have genetic testing. How is this treated? The goal of treatment is to make you comfortable and keep your baby safe. Your health care provider  may:  Prescribe medicine to reduce bile acid in your bloodstream, relieve symptoms, and help keep your baby safe.  Give you vitamin K before delivery to prevent excessive bleeding.  Check your baby frequently (fetal monitoring).  Perform regular blood tests to check your bile levels and liver function until your baby is delivered.  Recommend starting (inducing) your labor and delivery by week 36 or 37 of pregnancy, or as soon as your baby's lungs have developed enough. Follow these instructions at home:  Take over-the-counter and prescription medicines only as told by your health care provider.  Take cool baths to soothe itchy skin.  Wear comfortable, loose-fitting, cotton clothing to reduce itching.  Keep your fingernails short to prevent skin irritation from scratching.  Keep all follow-up visits and prenatal visits as told by your health care provider. This is important. Contact a health care provider if:  Your symptoms get worse, even with treatment.  You develop pain in your right side.  You have unusual swelling in your abdomen, feet, ankles, or legs.  You have a fever.  You are more thirsty than usual. Get help right away  if:  You go into early labor.  You have a headache that does not go away or causes changes in vision.  You have nausea or you vomit.  You have severe pain in your abdomen or shoulders.  You have shortness of breath. Summary  Cholestasis of pregnancy is most common toward the end of pregnancy (thirdtrimester), but it can occur any time during your pregnancy.  The condition often goes away soon after your baby is born.  The most common symptom of cholestasis of pregnancy is intense itching (pruritus), especially on the palms of your hands and soles of your feet.  This condition may be treated with medicine, frequent monitoring, or starting (inducing) labor and delivery by week 36 or 37 of pregnancy. This information is not intended to  replace advice given to you by your health care provider. Make sure you discuss any questions you have with your health care provider. Document Released: 05/27/2000 Document Revised: 09/20/2018 Document Reviewed: 05/14/2016 Elsevier Patient Education  2020 [employer]

## 2018-12-19 ENCOUNTER — Ambulatory Visit (HOSPITAL_COMMUNITY): Payer: 59 | Admitting: *Deleted

## 2018-12-19 ENCOUNTER — Encounter (HOSPITAL_COMMUNITY): Payer: Self-pay | Admitting: *Deleted

## 2018-12-19 ENCOUNTER — Other Ambulatory Visit: Payer: Self-pay

## 2018-12-19 ENCOUNTER — Ambulatory Visit (HOSPITAL_COMMUNITY)
Admission: RE | Admit: 2018-12-19 | Discharge: 2018-12-19 | Disposition: A | Discharge status: Home / Self Care | Payer: 59 | Source: Ambulatory Visit | Attending: Maternal & Fetal Medicine | Admitting: Maternal & Fetal Medicine

## 2018-12-19 DIAGNOSIS — K831 Obstruction of bile duct: Secondary | ICD-10-CM | POA: Diagnosis not present

## 2018-12-19 DIAGNOSIS — O99113 Other diseases of the blood and blood-forming organs and certain disorders involving the immune mechanism complicating pregnancy, third trimester: Secondary | ICD-10-CM

## 2018-12-19 DIAGNOSIS — Z148 Genetic carrier of other disease: Secondary | ICD-10-CM

## 2018-12-19 DIAGNOSIS — D696 Thrombocytopenia, unspecified: Secondary | ICD-10-CM

## 2018-12-19 DIAGNOSIS — Z3400 Encounter for supervision of normal first pregnancy, unspecified trimester: Secondary | ICD-10-CM | POA: Insufficient documentation

## 2018-12-19 DIAGNOSIS — O26613 Liver and biliary tract disorders in pregnancy, third trimester: Secondary | ICD-10-CM | POA: Diagnosis not present

## 2018-12-19 DIAGNOSIS — Z3A32 32 weeks gestation of pregnancy: Secondary | ICD-10-CM

## 2018-12-26 ENCOUNTER — Other Ambulatory Visit: Payer: Self-pay

## 2018-12-26 ENCOUNTER — Encounter (HOSPITAL_COMMUNITY): Payer: Self-pay

## 2018-12-26 ENCOUNTER — Ambulatory Visit (HOSPITAL_COMMUNITY)
Admission: RE | Admit: 2018-12-26 | Discharge: 2018-12-26 | Disposition: A | Discharge status: Home / Self Care | Payer: 59 | Source: Ambulatory Visit | Attending: Maternal & Fetal Medicine | Admitting: Maternal & Fetal Medicine

## 2018-12-26 ENCOUNTER — Ambulatory Visit (HOSPITAL_COMMUNITY): Payer: 59 | Admitting: *Deleted

## 2018-12-26 DIAGNOSIS — D696 Thrombocytopenia, unspecified: Secondary | ICD-10-CM

## 2018-12-26 DIAGNOSIS — O26613 Liver and biliary tract disorders in pregnancy, third trimester: Secondary | ICD-10-CM

## 2018-12-26 DIAGNOSIS — Z3400 Encounter for supervision of normal first pregnancy, unspecified trimester: Secondary | ICD-10-CM | POA: Insufficient documentation

## 2018-12-26 DIAGNOSIS — Z3A33 33 weeks gestation of pregnancy: Secondary | ICD-10-CM

## 2018-12-26 DIAGNOSIS — Z148 Genetic carrier of other disease: Secondary | ICD-10-CM

## 2018-12-26 DIAGNOSIS — K831 Obstruction of bile duct: Secondary | ICD-10-CM | POA: Insufficient documentation

## 2018-12-26 DIAGNOSIS — O99113 Other diseases of the blood and blood-forming organs and certain disorders involving the immune mechanism complicating pregnancy, third trimester: Secondary | ICD-10-CM | POA: Insufficient documentation

## 2019-01-02 ENCOUNTER — Other Ambulatory Visit: Payer: Self-pay

## 2019-01-02 ENCOUNTER — Encounter (HOSPITAL_COMMUNITY): Payer: Self-pay

## 2019-01-02 ENCOUNTER — Ambulatory Visit (HOSPITAL_COMMUNITY): Payer: Self-pay | Admitting: *Deleted

## 2019-01-02 ENCOUNTER — Ambulatory Visit (HOSPITAL_COMMUNITY)
Admission: RE | Admit: 2019-01-02 | Discharge: 2019-01-02 | Disposition: A | Discharge status: Home / Self Care | Payer: 59 | Source: Ambulatory Visit | Attending: Maternal & Fetal Medicine | Admitting: Maternal & Fetal Medicine

## 2019-01-02 DIAGNOSIS — Z3400 Encounter for supervision of normal first pregnancy, unspecified trimester: Secondary | ICD-10-CM | POA: Insufficient documentation

## 2019-01-02 DIAGNOSIS — Z3A34 34 weeks gestation of pregnancy: Secondary | ICD-10-CM

## 2019-01-02 DIAGNOSIS — O99113 Other diseases of the blood and blood-forming organs and certain disorders involving the immune mechanism complicating pregnancy, third trimester: Secondary | ICD-10-CM | POA: Insufficient documentation

## 2019-01-02 DIAGNOSIS — Z148 Genetic carrier of other disease: Secondary | ICD-10-CM

## 2019-01-02 DIAGNOSIS — O26613 Liver and biliary tract disorders in pregnancy, third trimester: Secondary | ICD-10-CM | POA: Diagnosis not present

## 2019-01-02 DIAGNOSIS — K831 Obstruction of bile duct: Secondary | ICD-10-CM | POA: Insufficient documentation

## 2019-01-02 DIAGNOSIS — D696 Thrombocytopenia, unspecified: Secondary | ICD-10-CM | POA: Diagnosis not present

## 2019-01-03 ENCOUNTER — Other Ambulatory Visit: Payer: Self-pay | Admitting: Obstetrics and Gynecology

## 2019-01-03 ENCOUNTER — Ambulatory Visit (INDEPENDENT_AMBULATORY_CARE_PROVIDER_SITE_OTHER): Payer: Self-pay | Admitting: Obstetrics and Gynecology

## 2019-01-03 ENCOUNTER — Encounter: Payer: Self-pay | Admitting: Obstetrics and Gynecology

## 2019-01-03 VITALS — BP 113/71 | HR 103 | Temp 98.1°F | Wt 195.0 lb

## 2019-01-03 DIAGNOSIS — Z113 Encounter for screening for infections with a predominantly sexual mode of transmission: Secondary | ICD-10-CM

## 2019-01-03 DIAGNOSIS — O26613 Liver and biliary tract disorders in pregnancy, third trimester: Secondary | ICD-10-CM

## 2019-01-03 DIAGNOSIS — Z3400 Encounter for supervision of normal first pregnancy, unspecified trimester: Secondary | ICD-10-CM

## 2019-01-03 DIAGNOSIS — O0993 Supervision of high risk pregnancy, unspecified, third trimester: Secondary | ICD-10-CM

## 2019-01-03 DIAGNOSIS — Z3A35 35 weeks gestation of pregnancy: Secondary | ICD-10-CM

## 2019-01-03 DIAGNOSIS — N898 Other specified noninflammatory disorders of vagina: Secondary | ICD-10-CM | POA: Diagnosis not present

## 2019-01-03 DIAGNOSIS — O26619 Liver and biliary tract disorders in pregnancy, unspecified trimester: Secondary | ICD-10-CM

## 2019-01-03 DIAGNOSIS — K831 Obstruction of bile duct: Secondary | ICD-10-CM

## 2019-01-03 LAB — OB RESULTS CONSOLE GBS: GBS: NEGATIVE

## 2019-01-03 NOTE — Progress Notes (Signed)
   PRENATAL VISIT NOTE  Subjective:  Dorothy Olsen is a 28 y.o. G1P0 at [redacted]w[redacted]d being seen today for ongoing prenatal care.  She is currently monitored for the following issues for this high-risk pregnancy and has Supervision of normal first pregnancy, antepartum; Cholestasis of pregnancy, antepartum; and Gestational thrombocytopenia ([place]) on their problem list.  Patient reports occ. pelvic pressure. Itching status is "about the same" as it always has been.  Contractions: Irritability. Vag. Bleeding: None.  Movement: Present. Denies leaking of fluid.   The following portions of the patient's history were reviewed and updated as appropriate: allergies, current medications, past family history, past medical history, past social history, past surgical history and problem list.   Objective:   Vitals:   01/03/19 1336  BP: 113/71  Pulse: (!) 103  Temp: 98.1 F (36.7 C)  Weight: 195 lb (88.5 kg)    Fetal Status: Fetal Heart Rate (bpm): 147 Fundal Height: 34 cm Movement: Present  Presentation: Vertex  General:  Alert, oriented and cooperative. Patient is in no acute distress.  Skin: Skin is warm and dry. No rash noted.   Cardiovascular: Normal heart rate noted  Respiratory: Normal respiratory effort, no problems with respiration noted  Abdomen: Soft, gravid, appropriate for gestational age.  Pain/Pressure: Present     Pelvic: Cervical exam performed Dilation: Fingertip Effacement (%): 50 Station: Ballotable  Extremities: Normal range of motion.  Edema: Trace  Mental Status: Normal mood and affect. Normal behavior. Normal judgment and thought content.   Assessment and Plan:  Pregnancy: G1P0 at [redacted]w[redacted]d 1. Supervision of high risk first pregnancy, antepartum - Culture, beta strep (group b only) - Cervicovaginal ancillary only( [place]) - IOL scheduled for 01/17/2019 @ 0700 -- orders entered  Preterm labor symptoms and general obstetric precautions including but not limited to vaginal  bleeding, contractions, leaking of fluid and fetal movement were reviewed in detail with the patient. Please refer to After Visit Summary for other counseling recommendations.   Return in about 1 week (around 01/10/2019) for Return OB - My Chart video.  Future Appointments  Date Time Provider [place]  01/09/2019  3:30 PM [place] [facility]  01/09/2019  3:30 PM [place] [facility]  01/10/2019  3:50 PM Laury Deep, CNM [facility] None  01/17/2019  7:00 AM [facility] [place] None    Laury Deep, CNM

## 2019-01-03 NOTE — Progress Notes (Signed)
IOL orders done

## 2019-01-05 LAB — CERVICOVAGINAL ANCILLARY ONLY
Bacterial vaginitis: NEGATIVE
Candida vaginitis: NEGATIVE
Chlamydia: NEGATIVE
Neisseria Gonorrhea: NEGATIVE
Trichomonas: NEGATIVE

## 2019-01-07 ENCOUNTER — Telehealth (HOSPITAL_COMMUNITY): Payer: Self-pay | Admitting: *Deleted

## 2019-01-07 LAB — CULTURE, BETA STREP (GROUP B ONLY): Strep Gp B Culture: NEGATIVE

## 2019-01-07 NOTE — Telephone Encounter (Signed)
Preadmission screen  

## 2019-01-09 ENCOUNTER — Other Ambulatory Visit: Payer: Self-pay

## 2019-01-09 ENCOUNTER — Encounter (HOSPITAL_COMMUNITY): Payer: Self-pay

## 2019-01-09 ENCOUNTER — Ambulatory Visit (HOSPITAL_COMMUNITY)
Admission: RE | Admit: 2019-01-09 | Discharge: 2019-01-09 | Disposition: A | Discharge status: Home / Self Care | Payer: 59 | Source: Ambulatory Visit | Attending: Maternal & Fetal Medicine | Admitting: Maternal & Fetal Medicine

## 2019-01-09 ENCOUNTER — Telehealth (HOSPITAL_COMMUNITY): Payer: Self-pay | Admitting: *Deleted

## 2019-01-09 ENCOUNTER — Ambulatory Visit (HOSPITAL_COMMUNITY): Payer: 59 | Admitting: *Deleted

## 2019-01-09 ENCOUNTER — Encounter: Payer: Self-pay | Admitting: General Practice

## 2019-01-09 DIAGNOSIS — Z3400 Encounter for supervision of normal first pregnancy, unspecified trimester: Secondary | ICD-10-CM

## 2019-01-09 DIAGNOSIS — K831 Obstruction of bile duct: Secondary | ICD-10-CM | POA: Diagnosis not present

## 2019-01-09 DIAGNOSIS — Z3A35 35 weeks gestation of pregnancy: Secondary | ICD-10-CM

## 2019-01-09 DIAGNOSIS — Z362 Encounter for other antenatal screening follow-up: Secondary | ICD-10-CM

## 2019-01-09 DIAGNOSIS — Z148 Genetic carrier of other disease: Secondary | ICD-10-CM

## 2019-01-09 DIAGNOSIS — D696 Thrombocytopenia, unspecified: Secondary | ICD-10-CM

## 2019-01-09 DIAGNOSIS — O26643 Intrahepatic cholestasis of pregnancy, third trimester: Secondary | ICD-10-CM

## 2019-01-09 DIAGNOSIS — O99113 Other diseases of the blood and blood-forming organs and certain disorders involving the immune mechanism complicating pregnancy, third trimester: Secondary | ICD-10-CM | POA: Insufficient documentation

## 2019-01-09 DIAGNOSIS — O26613 Liver and biliary tract disorders in pregnancy, third trimester: Secondary | ICD-10-CM | POA: Diagnosis not present

## 2019-01-09 NOTE — Telephone Encounter (Signed)
Preadmission screen  

## 2019-01-10 ENCOUNTER — Encounter: Payer: Self-pay | Admitting: Obstetrics and Gynecology

## 2019-01-10 ENCOUNTER — Encounter (HOSPITAL_COMMUNITY): Payer: Self-pay | Admitting: *Deleted

## 2019-01-10 ENCOUNTER — Telehealth (INDEPENDENT_AMBULATORY_CARE_PROVIDER_SITE_OTHER): Payer: 59 | Admitting: Obstetrics and Gynecology

## 2019-01-10 ENCOUNTER — Encounter: Payer: 59 | Admitting: Obstetrics and Gynecology

## 2019-01-10 ENCOUNTER — Telehealth (HOSPITAL_COMMUNITY): Payer: Self-pay | Admitting: *Deleted

## 2019-01-10 VITALS — BP 125/72 | Wt 195.4 lb

## 2019-01-10 DIAGNOSIS — O26619 Liver and biliary tract disorders in pregnancy, unspecified trimester: Secondary | ICD-10-CM

## 2019-01-10 DIAGNOSIS — Z3A36 36 weeks gestation of pregnancy: Secondary | ICD-10-CM

## 2019-01-10 DIAGNOSIS — K831 Obstruction of bile duct: Secondary | ICD-10-CM | POA: Diagnosis not present

## 2019-01-10 DIAGNOSIS — O26613 Liver and biliary tract disorders in pregnancy, third trimester: Secondary | ICD-10-CM | POA: Diagnosis not present

## 2019-01-10 DIAGNOSIS — Z3400 Encounter for supervision of normal first pregnancy, unspecified trimester: Secondary | ICD-10-CM

## 2019-01-10 NOTE — Progress Notes (Addendum)
   MY CHART VIDEO VIRTUAL OBSTETRICS VISIT ENCOUNTER NOTE  I connected with Dorothy Olsen on 01/10/19 at  3:50 PM EDT by My Chart video at home and verified that I am speaking with the correct person using two identifiers.   I discussed the limitations, risks, security and privacy concerns of performing an evaluation and management service by My Chart video and the availability of in person appointments. I also discussed with the patient that there may be a patient responsible charge related to this service. The patient expressed understanding and agreed to proceed.  Subjective:  Dorothy Olsen is a 28 y.o. G1P0 at [redacted]w[redacted]d being followed for ongoing prenatal care.  She is currently monitored for the following issues for this low-risk pregnancy and has Supervision of normal first pregnancy, antepartum; Cholestasis of pregnancy, antepartum; and Gestational thrombocytopenia ([place]) on their problem list.  Patient reports "some soreness". Reports fetal movement. Denies any contractions, bleeding or leaking of fluid.   The following portions of the patient's history were reviewed and updated as appropriate: allergies, current medications, past family history, past medical history, past social history, past surgical history and problem list.   Objective:   General:  Alert, oriented and cooperative.   Mental Status: Normal mood and affect perceived. Normal judgment and thought content.  Rest of physical exam deferred due to type of encounter  BP 125/72   Wt 195 lb 6.4 oz (88.6 kg)   LMP 05/03/2018 (Exact Date)   BMI 31.54 kg/m  **Done by patient's own at home BP cuff and scale  Assessment and Plan:  Pregnancy: G1P0 at [redacted]w[redacted]d  1. Supervision of normal first pregnancy, antepartum - Discussed Neg GS status - Keep scheduled appts at [facility] for IOL process - Will be seen via [facility] for PP at 4-6 wks - She desires Depo for 6 months and then transition to NuvaRing  2. Cholestasis of pregnancy, antepartum  - IOL @ 37 wks per MFM (scheduled for 01/17/19)   Preterm labor symptoms and general obstetric precautions including but not limited to vaginal bleeding, contractions, leaking of fluid and fetal movement were reviewed in detail with the patient.  I discussed the assessment and treatment plan with the patient. The patient was provided an opportunity to ask questions and all were answered. The patient agreed with the plan and demonstrated an understanding of the instructions. The patient was advised to call back or seek an in-person office evaluation/go to [facility] at [facility] for any urgent or concerning symptoms. Please refer to After Visit Summary for other counseling recommendations.   I provided 5 minutes of non-face-to-face time during this encounter. There was 5 minutes of chart review time spent prior to this encounter. Total time spent = 10 minutes.  No follow-ups on file.  Future Appointments  Date Time Provider [place]  01/15/2019  8:10 AM [facility] 1 [facility] None  01/17/2019  7:00 AM [facility] [place] None    Laury Deep, [place] for [employer], [place]

## 2019-01-10 NOTE — Telephone Encounter (Signed)
Preadmission screen  

## 2019-01-10 NOTE — Patient Instructions (Signed)
Fetal Movement Counts Patient Name: ________________________________________________ Patient Due Date: ____________________ What is a fetal movement count?  A fetal movement count is the number of times that you feel your baby move during a certain amount of time. This may also be called a fetal kick count. A fetal movement count is recommended for every pregnant woman. You may be asked to start counting fetal movements as early as week 28 of your pregnancy. Pay attention to when your baby is most active. You may notice your baby's sleep and wake cycles. You may also notice things that make your baby move more. You should do a fetal movement count:  When your baby is normally most active.  At the same time each day. A good time to count movements is while you are resting, after having something to eat and drink. How do I count fetal movements? 1. Find a quiet, comfortable area. Sit, or lie down on your side. 2. Write down the date, the start time and stop time, and the number of movements that you felt between those two times. Take this information with you to your health care visits. 3. For 2 hours, count kicks, flutters, swishes, rolls, and jabs. You should feel at least 10 movements during 2 hours. 4. You may stop counting after you have felt 10 movements. 5. If you do not feel 10 movements in 2 hours, have something to eat and drink. Then, keep resting and counting for 1 hour. If you feel at least 4 movements during that hour, you may stop counting. Contact a health care provider if:  You feel fewer than 4 movements in 2 hours.  Your baby is not moving like he or she usually does. Date: ____________ Start time: ____________ Stop time: ____________ Movements: ____________ Date: ____________ Start time: ____________ Stop time: ____________ Movements: ____________ Date: ____________ Start time: ____________ Stop time: ____________ Movements: ____________ Date: ____________ Start time:  ____________ Stop time: ____________ Movements: ____________ Date: ____________ Start time: ____________ Stop time: ____________ Movements: ____________ Date: ____________ Start time: ____________ Stop time: ____________ Movements: ____________ Date: ____________ Start time: ____________ Stop time: ____________ Movements: ____________ Date: ____________ Start time: ____________ Stop time: ____________ Movements: ____________ Date: ____________ Start time: ____________ Stop time: ____________ Movements: ____________ This information is not intended to replace advice given to you by your health care provider. Make sure you discuss any questions you have with your health care provider. Document Released: 06/29/2006 Document Revised: 06/19/2018 Document Reviewed: 07/09/2015 Elsevier Patient Education  2020 Elsevier Inc. Braxton Hicks Contractions Contractions of the uterus can occur throughout pregnancy, but they are not always a sign that you are in labor. You may have practice contractions called Braxton Hicks contractions. These false labor contractions are sometimes confused with true labor. What are Braxton Hicks contractions? Braxton Hicks contractions are tightening movements that occur in the muscles of the uterus before labor. Unlike true labor contractions, these contractions do not result in opening (dilation) and thinning of the cervix. Toward the end of pregnancy (32-34 weeks), Braxton Hicks contractions can happen more often and may become stronger. These contractions are sometimes difficult to tell apart from true labor because they can be very uncomfortable. You should not feel embarrassed if you go to the hospital with false labor. Sometimes, the only way to tell if you are in true labor is for your health care provider to look for changes in the cervix. The health care provider will do a physical exam and may monitor your contractions. If you   are not in true labor, the exam should show  that your cervix is not dilating and your water has not broken. If there are no other health problems associated with your pregnancy, it is completely safe for you to be sent home with false labor. You may continue to have Braxton Hicks contractions until you go into true labor. How to tell the difference between true labor and false labor True labor  Contractions last 30-70 seconds.  Contractions become very regular.  Discomfort is usually felt in the top of the uterus, and it spreads to the lower abdomen and low back.  Contractions do not go away with walking.  Contractions usually become more intense and increase in frequency.  The cervix dilates and gets thinner. False labor  Contractions are usually shorter and not as strong as true labor contractions.  Contractions are usually irregular.  Contractions are often felt in the front of the lower abdomen and in the groin.  Contractions may go away when you walk around or change positions while lying down.  Contractions get weaker and are shorter-lasting as time goes on.  The cervix usually does not dilate or become thin. Follow these instructions at home:   Take over-the-counter and prescription medicines only as told by your health care provider.  Keep up with your usual exercises and follow other instructions from your health care provider.  Eat and drink lightly if you think you are going into labor.  If Braxton Hicks contractions are making you uncomfortable: ? Change your position from lying down or resting to walking, or change from walking to resting. ? Sit and rest in a tub of warm water. ? Drink enough fluid to keep your urine pale yellow. Dehydration may cause these contractions. ? Do slow and deep breathing several times an hour.  Keep all follow-up prenatal visits as told by your health care provider. This is important. Contact a health care provider if:  You have a fever.  You have continuous pain in  your abdomen. Get help right away if:  Your contractions become stronger, more regular, and closer together.  You have fluid leaking or gushing from your vagina.  You pass blood-tinged mucus (bloody show).  You have bleeding from your vagina.  You have low back pain that you never had before.  You feel your baby's head pushing down and causing pelvic pressure.  Your baby is not moving inside you as much as it used to. Summary  Contractions that occur before labor are called Braxton Hicks contractions, false labor, or practice contractions.  Braxton Hicks contractions are usually shorter, weaker, farther apart, and less regular than true labor contractions. True labor contractions usually become progressively stronger and regular, and they become more frequent.  Manage discomfort from Braxton Hicks contractions by changing position, resting in a warm bath, drinking plenty of water, or practicing deep breathing. This information is not intended to replace advice given to you by your health care provider. Make sure you discuss any questions you have with your health care provider. Document Released: 10/13/2016 Document Revised: 05/12/2017 Document Reviewed: 10/13/2016 Elsevier Patient Education  2020 Elsevier Inc.  

## 2019-01-15 ENCOUNTER — Other Ambulatory Visit: Payer: Self-pay

## 2019-01-15 ENCOUNTER — Other Ambulatory Visit (HOSPITAL_COMMUNITY)
Admission: RE | Admit: 2019-01-15 | Discharge: 2019-01-15 | Disposition: A | Discharge status: Home / Self Care | Payer: HRSA Program | Source: Ambulatory Visit | Attending: Obstetrics and Gynecology | Admitting: Obstetrics and Gynecology

## 2019-01-15 DIAGNOSIS — Z01812 Encounter for preprocedural laboratory examination: Secondary | ICD-10-CM | POA: Diagnosis present

## 2019-01-15 DIAGNOSIS — Z20828 Contact with and (suspected) exposure to other viral communicable diseases: Secondary | ICD-10-CM | POA: Diagnosis not present

## 2019-01-15 LAB — SARS CORONAVIRUS 2 (TAT 6-24 HRS): SARS Coronavirus 2: NEGATIVE

## 2019-01-16 ENCOUNTER — Other Ambulatory Visit: Payer: Self-pay | Admitting: Certified Nurse Midwife

## 2019-01-17 ENCOUNTER — Inpatient Hospital Stay (HOSPITAL_COMMUNITY): Payer: Medicaid Other

## 2019-01-17 ENCOUNTER — Encounter (HOSPITAL_COMMUNITY): Payer: Self-pay | Admitting: *Deleted

## 2019-01-17 ENCOUNTER — Inpatient Hospital Stay (HOSPITAL_COMMUNITY)
Admission: AD | Admit: 2019-01-17 | Discharge: 2019-01-20 | DRG: 805 | Disposition: A | Discharge status: Home / Self Care | Payer: Medicaid Other | Attending: Obstetrics & Gynecology | Admitting: Obstetrics & Gynecology

## 2019-01-17 ENCOUNTER — Other Ambulatory Visit: Payer: Self-pay

## 2019-01-17 DIAGNOSIS — O99119 Other diseases of the blood and blood-forming organs and certain disorders involving the immune mechanism complicating pregnancy, unspecified trimester: Secondary | ICD-10-CM

## 2019-01-17 DIAGNOSIS — Z880 Allergy status to penicillin: Secondary | ICD-10-CM | POA: Diagnosis not present

## 2019-01-17 DIAGNOSIS — Z3400 Encounter for supervision of normal first pregnancy, unspecified trimester: Secondary | ICD-10-CM

## 2019-01-17 DIAGNOSIS — O9912 Other diseases of the blood and blood-forming organs and certain disorders involving the immune mechanism complicating childbirth: Secondary | ICD-10-CM | POA: Diagnosis present

## 2019-01-17 DIAGNOSIS — D6959 Other secondary thrombocytopenia: Secondary | ICD-10-CM | POA: Diagnosis present

## 2019-01-17 DIAGNOSIS — O26619 Liver and biliary tract disorders in pregnancy, unspecified trimester: Secondary | ICD-10-CM | POA: Diagnosis present

## 2019-01-17 DIAGNOSIS — O1002 Pre-existing essential hypertension complicating childbirth: Secondary | ICD-10-CM | POA: Diagnosis present

## 2019-01-17 DIAGNOSIS — O2662 Liver and biliary tract disorders in childbirth: Secondary | ICD-10-CM | POA: Diagnosis present

## 2019-01-17 DIAGNOSIS — D696 Thrombocytopenia, unspecified: Secondary | ICD-10-CM | POA: Diagnosis present

## 2019-01-17 DIAGNOSIS — O26649 Intrahepatic cholestasis of pregnancy, unspecified trimester: Secondary | ICD-10-CM | POA: Diagnosis present

## 2019-01-17 DIAGNOSIS — O114 Pre-existing hypertension with pre-eclampsia, complicating childbirth: Secondary | ICD-10-CM | POA: Diagnosis present

## 2019-01-17 DIAGNOSIS — Z3A37 37 weeks gestation of pregnancy: Secondary | ICD-10-CM | POA: Diagnosis not present

## 2019-01-17 DIAGNOSIS — K831 Obstruction of bile duct: Secondary | ICD-10-CM | POA: Diagnosis present

## 2019-01-17 LAB — ABO/RH: ABO/RH(D): O POS

## 2019-01-17 LAB — TYPE AND SCREEN
ABO/RH(D): O POS
Antibody Screen: NEGATIVE

## 2019-01-17 LAB — CBC
HCT: 32.7 % — ABNORMAL LOW (ref 36.0–46.0)
Hemoglobin: 10.7 g/dL — ABNORMAL LOW (ref 12.0–15.0)
MCH: 28.2 pg (ref 26.0–34.0)
MCHC: 32.7 g/dL (ref 30.0–36.0)
MCV: 86.1 fL (ref 80.0–100.0)
Platelets: 136 10*3/uL — ABNORMAL LOW (ref 150–400)
RBC: 3.8 MIL/uL — ABNORMAL LOW (ref 3.87–5.11)
RDW: 14 % (ref 11.5–15.5)
WBC: 7 10*3/uL (ref 4.0–10.5)
nRBC: 0 % (ref 0.0–0.2)

## 2019-01-17 LAB — RPR: RPR Ser Ql: NONREACTIVE

## 2019-01-17 MED ORDER — FENTANYL CITRATE (PF) 100 MCG/2ML IJ SOLN
100.0000 ug | INTRAMUSCULAR | Status: DC | PRN
  Administered 2019-01-17 – 2019-01-18 (×3): 100 ug via INTRAVENOUS
  Filled 2019-01-17 (×3): qty 2

## 2019-01-17 MED ORDER — OXYTOCIN 40 UNITS IN NORMAL SALINE INFUSION - SIMPLE MED: 2.5000 [IU]/h | INTRAVENOUS | Status: DC

## 2019-01-17 MED ORDER — ACETAMINOPHEN 325 MG PO TABS: 650.0000 mg | ORAL_TABLET | ORAL | Status: DC | PRN

## 2019-01-17 MED ORDER — OXYCODONE-ACETAMINOPHEN 5-325 MG PO TABS: 1.0000 | ORAL_TABLET | ORAL | Status: DC | PRN

## 2019-01-17 MED ORDER — TERBUTALINE SULFATE 1 MG/ML IJ SOLN: 0.2500 mg | Freq: Once | INTRAMUSCULAR | Status: DC | PRN

## 2019-01-17 MED ORDER — OXYCODONE-ACETAMINOPHEN 5-325 MG PO TABS: 2.0000 | ORAL_TABLET | ORAL | Status: DC | PRN

## 2019-01-17 MED ORDER — MISOPROSTOL 25 MCG QUARTER TABLET
25.0000 ug | ORAL_TABLET | ORAL | Status: DC
  Administered 2019-01-17 (×2): 25 ug via VAGINAL
  Filled 2019-01-17: qty 1

## 2019-01-17 MED ORDER — ONDANSETRON HCL 4 MG/2ML IJ SOLN: 4.0000 mg | Freq: Four times a day (QID) | INTRAMUSCULAR | Status: DC | PRN

## 2019-01-17 MED ORDER — MISOPROSTOL 50MCG HALF TABLET
50.0000 ug | ORAL_TABLET | ORAL | Status: DC
  Administered 2019-01-17: 50 ug via ORAL
  Filled 2019-01-17: qty 1

## 2019-01-17 MED ORDER — ZOLPIDEM TARTRATE 5 MG PO TABS: 5.0000 mg | ORAL_TABLET | Freq: Every evening | ORAL | Status: DC | PRN

## 2019-01-17 MED ORDER — LACTATED RINGERS IV SOLN: 500.0000 mL | INTRAVENOUS | Status: DC | PRN

## 2019-01-17 MED ORDER — OXYTOCIN BOLUS FROM INFUSION
500.0000 mL | Freq: Once | INTRAVENOUS | Status: AC
  Administered 2019-01-18: 500 mL via INTRAVENOUS

## 2019-01-17 MED ORDER — OXYTOCIN 40 UNITS IN NORMAL SALINE INFUSION - SIMPLE MED
1.0000 m[IU]/min | INTRAVENOUS | Status: DC
  Administered 2019-01-18: 2 m[IU]/min via INTRAVENOUS
  Filled 2019-01-17: qty 1000

## 2019-01-17 MED ORDER — OXYTOCIN 10 UNIT/ML IJ SOLN: 10.0000 [IU] | Freq: Once | INTRAMUSCULAR | Status: DC | PRN

## 2019-01-17 MED ORDER — LACTATED RINGERS IV SOLN
INTRAVENOUS | Status: DC
  Administered 2019-01-17: 125 mL/h via INTRAVENOUS
  Administered 2019-01-18: 14:00:00 via INTRAVENOUS

## 2019-01-17 MED ORDER — SOD CITRATE-CITRIC ACID 500-334 MG/5ML PO SOLN: 30.0000 mL | ORAL | Status: DC | PRN

## 2019-01-17 MED ORDER — MISOPROSTOL 25 MCG QUARTER TABLET
ORAL_TABLET | ORAL | Status: AC
  Administered 2019-01-17: 50 ug via ORAL
  Filled 2019-01-17: qty 1

## 2019-01-17 MED ORDER — LIDOCAINE HCL (PF) 1 % IJ SOLN: 30.0000 mL | INTRAMUSCULAR | Status: DC | PRN

## 2019-01-17 NOTE — H&P (Signed)
HPI: Dorothy Olsen is a 28 y.o. year old G37P0 female at [redacted]w[redacted]d weeks gestation who presents to for IOL for ICP. Denies contractions, SROM or VB.   Nursing Staff Provider  Office Location  [facility] Dating  LMP  Language  English Anatomy US  Normal  Flu Vaccine  Declined Genetic Screen  NIPS: Low risk female  TDaP vaccine   11/08/2018 Hgb A1C or  GTT Early Not done Third trimester Normal  Rhogam  N/A   LAB RESULTS   Feeding Plan Breast Blood Type O/Positive/-- (02/05 1529)   Contraception Undecided Antibody Negative (02/05 1529)  Circumcision If boy, No Rubella 2.33 (02/05 1529)  Pediatrician  Undecided RPR Non Reactive (02/05 1529)   Support Person FOB-[place]hea HBsAg Negative (02/05 1529)   Prenatal Classes Info given HIV Non Reactive (02/05 1529)  BTL Consent N/A GBS  (For PCN allergy, check sensitivities) Neg  VBAC Consent N/A Pap Negative: The absence of an endocervical / transformation zone component is not uncommon in pregnant patients.    Hgb Electro  Silent Carrier for Alpha-Thalassemia FOB testing recommended    CF Negative    SMA Negative    Waterbirth  [ ]  Class [ ]  Consent [ ]  CNM visit    OB History    Gravida  1   Para      Term      Preterm      AB      Living  0     SAB      TAB      Ectopic      Multiple      Live Births             Past Medical History:  Diagnosis Date  . Cholestasis   . Gestational thrombocytopenia ([place])   . Medical history non-contributory    Past Surgical History:  Procedure Laterality Date  . NO PAST SURGERIES    . WISDOM TOOTH EXTRACTION     Family History: family history includes Diabetes in her mother, paternal grandfather, and paternal grandmother. Social History:  reports that she has never smoked. She has never used smokeless tobacco. She reports previous alcohol use. She reports previous drug use.     Maternal Diabetes: No Genetic Screening: Normal Maternal Ultrasounds/Referrals: Normal Fetal Ultrasounds  or other Referrals:  None Maternal Substance Abuse:  No Significant Maternal Medications:  None Significant Maternal Lab Results:  Group B Strep negative Other Comments:  Silent Carrier for Alpha-Thalassemia.  ROS Maternal Medical History:  Reason for admission: IOL  Fetal activity: Perceived fetal activity is normal.    Prenatal complications: Thrombocytopenia.   Cholestasis  Prenatal Complications - Diabetes: none.    Dilation: Fingertip Effacement (%): Thick Station: Ballotable Exam by:: Mabeline Caras, RN Blood pressure 117/79, pulse (!) 115, temperature 98.7 F (37.1 C), temperature source Oral, resp. rate 16, height 5\' 5"  (1.651 m), weight 89 kg, last menstrual period 05/03/2018. Maternal Exam:  Uterine Assessment: Contraction strength is mild.  Contraction frequency is rare.   Abdomen: Patient reports no abdominal tenderness. Estimated fetal weight is 5-15 at 36 weeks.   Fetal presentation: vertex  Introitus: Normal vulva. Normal vagina.  Pelvis: adequate for delivery.   Cervix: Cervix evaluated by digital exam.     Fetal Exam Fetal Monitor Review: Mode: ultrasound.   Baseline rate: 135.  Variability: moderate (6-25 bpm).   Pattern: no decelerations and accelerations present.    Fetal State Assessment: Category I - tracings are normal.  Physical Exam  Nursing note and vitals reviewed. Constitutional: She is oriented to person, place, and time. She appears well-developed and well-nourished. No distress.  Eyes: No scleral icterus.  Cardiovascular: Normal rate.  Respiratory: Effort normal.  GI: Soft. There is no abdominal tenderness.  Genitourinary:    Vulva, vagina and uterus normal.   Musculoskeletal:        General: No edema.  Neurological: She is alert and oriented to person, place, and time. She has normal reflexes.  Skin: Skin is warm and dry.  Psychiatric: She has a normal mood and affect.    Prenatal labs: ABO, Rh: --/--/O POS (08/06  [id number]) Antibody: NEG (08/06 0737) Rubella: 2.33 (02/05 1529) RPR: Non Reactive (05/28 0836)  HBsAg: Negative (02/05 1529)  HIV: Non Reactive (05/28 0836)  GBS: Negative (07/23 0000)  GTT Nml NIPS: Nml  Assessment: 1. Labor: IOL 2. Fetal Wellbeing: Category I  3. Pain Control: None 4. GBS: Neg 5. 37.0 week IUP 6. ICP  Plan:  1. Admit to BS per consult with MD 2. Routine L&D orders 3. Analgesia/anesthesia PRN  4. Cytotec then Foley  [place] Trayden Brandy 01/17/2019, 10:04 AM

## 2019-01-17 NOTE — Progress Notes (Signed)
Vitals:   01/17/19 1800 01/17/19 1833  BP: 108/78 103/64  Pulse: 82 (!) 102  Resp: 16 16  Temp:  98.8 F (37.1 C)   Not itching. FHR Cat 1. Irregular and mild ctx. Foley still in. Will start pitocin when foley falls out. Continue cytotec

## 2019-01-18 ENCOUNTER — Inpatient Hospital Stay (HOSPITAL_COMMUNITY): Payer: Medicaid Other | Admitting: Anesthesiology

## 2019-01-18 DIAGNOSIS — K81 Cholecystitis: Secondary | ICD-10-CM

## 2019-01-18 DIAGNOSIS — Z3A37 37 weeks gestation of pregnancy: Secondary | ICD-10-CM

## 2019-01-18 LAB — CBC
HCT: 35.5 % — ABNORMAL LOW (ref 36.0–46.0)
HCT: 33.4 % — ABNORMAL LOW (ref 36.0–46.0)
Hemoglobin: 11.5 g/dL — ABNORMAL LOW (ref 12.0–15.0)
Hemoglobin: 10.9 g/dL — ABNORMAL LOW (ref 12.0–15.0)
MCH: 28.4 pg (ref 26.0–34.0)
MCH: 28.5 pg (ref 26.0–34.0)
MCHC: 32.4 g/dL (ref 30.0–36.0)
MCHC: 32.6 g/dL (ref 30.0–36.0)
MCV: 87.7 fL (ref 80.0–100.0)
MCV: 87.2 fL (ref 80.0–100.0)
Platelets: 138 10*3/uL — ABNORMAL LOW (ref 150–400)
Platelets: 132 10*3/uL — ABNORMAL LOW (ref 150–400)
RBC: 4.05 MIL/uL (ref 3.87–5.11)
RBC: 3.83 MIL/uL — ABNORMAL LOW (ref 3.87–5.11)
RDW: 14.1 % (ref 11.5–15.5)
RDW: 14.3 % (ref 11.5–15.5)
WBC: 12.1 10*3/uL — ABNORMAL HIGH (ref 4.0–10.5)
WBC: 13.2 10*3/uL — ABNORMAL HIGH (ref 4.0–10.5)
nRBC: 0 % (ref 0.0–0.2)
nRBC: 0 % (ref 0.0–0.2)

## 2019-01-18 MED ORDER — DIPHENHYDRAMINE HCL 25 MG PO CAPS: 25.0000 mg | ORAL_CAPSULE | Freq: Four times a day (QID) | ORAL | Status: DC | PRN

## 2019-01-18 MED ORDER — LIDOCAINE HCL (PF) 1 % IJ SOLN
INTRAMUSCULAR | Status: DC | PRN
  Administered 2019-01-18: 6 mL via EPIDURAL

## 2019-01-18 MED ORDER — SODIUM CHLORIDE (PF) 0.9 % IJ SOLN
INTRAMUSCULAR | Status: DC | PRN
  Administered 2019-01-18: 12 mL/h via EPIDURAL

## 2019-01-18 MED ORDER — COCONUT OIL OIL
1.0000 "application " | TOPICAL_OIL | Status: DC | PRN
  Administered 2019-01-19: 1 via TOPICAL

## 2019-01-18 MED ORDER — ACETAMINOPHEN 325 MG PO TABS
650.0000 mg | ORAL_TABLET | ORAL | Status: DC | PRN
  Administered 2019-01-18: 650 mg via ORAL
  Filled 2019-01-18: qty 2

## 2019-01-18 MED ORDER — ONDANSETRON HCL 4 MG PO TABS: 4.0000 mg | ORAL_TABLET | ORAL | Status: DC | PRN

## 2019-01-18 MED ORDER — ONDANSETRON HCL 4 MG/2ML IJ SOLN: 4.0000 mg | INTRAMUSCULAR | Status: DC | PRN

## 2019-01-18 MED ORDER — FENTANYL-BUPIVACAINE-NACL 0.5-0.125-0.9 MG/250ML-% EP SOLN
12.0000 mL/h | EPIDURAL | Status: DC | PRN
  Filled 2019-01-18: qty 250

## 2019-01-18 MED ORDER — IBUPROFEN 600 MG PO TABS
600.0000 mg | ORAL_TABLET | Freq: Four times a day (QID) | ORAL | Status: DC
  Administered 2019-01-18 – 2019-01-20 (×7): 600 mg via ORAL
  Filled 2019-01-18 (×7): qty 1

## 2019-01-18 MED ORDER — PHENYLEPHRINE 40 MCG/ML (10ML) SYRINGE FOR IV PUSH (FOR BLOOD PRESSURE SUPPORT): 80.0000 ug | PREFILLED_SYRINGE | INTRAVENOUS | Status: DC | PRN

## 2019-01-18 MED ORDER — ZOLPIDEM TARTRATE 5 MG PO TABS: 5.0000 mg | ORAL_TABLET | Freq: Every evening | ORAL | Status: DC | PRN

## 2019-01-18 MED ORDER — EPHEDRINE 5 MG/ML INJ: 10.0000 mg | INTRAVENOUS | Status: DC | PRN

## 2019-01-18 MED ORDER — WITCH HAZEL-GLYCERIN EX PADS
1.0000 "application " | MEDICATED_PAD | CUTANEOUS | Status: DC | PRN
  Administered 2019-01-19: 1 via TOPICAL

## 2019-01-18 MED ORDER — TETANUS-DIPHTH-ACELL PERTUSSIS 5-2.5-18.5 LF-MCG/0.5 IM SUSP: 0.5000 mL | Freq: Once | INTRAMUSCULAR | Status: DC

## 2019-01-18 MED ORDER — SIMETHICONE 80 MG PO CHEW: 80.0000 mg | CHEWABLE_TABLET | ORAL | Status: DC | PRN

## 2019-01-18 MED ORDER — DIBUCAINE (PERIANAL) 1 % EX OINT: 1.0000 "application " | TOPICAL_OINTMENT | CUTANEOUS | Status: DC | PRN

## 2019-01-18 MED ORDER — PRENATAL MULTIVITAMIN CH
1.0000 | ORAL_TABLET | Freq: Every day | ORAL | Status: DC
  Administered 2019-01-19 – 2019-01-20 (×2): 1 via ORAL
  Filled 2019-01-18 (×2): qty 1

## 2019-01-18 MED ORDER — SENNOSIDES-DOCUSATE SODIUM 8.6-50 MG PO TABS
2.0000 | ORAL_TABLET | ORAL | Status: DC
  Administered 2019-01-18 – 2019-01-19 (×2): 2 via ORAL
  Filled 2019-01-18 (×2): qty 2

## 2019-01-18 MED ORDER — BENZOCAINE-MENTHOL 20-0.5 % EX AERO
1.0000 "application " | INHALATION_SPRAY | CUTANEOUS | Status: DC | PRN
  Administered 2019-01-19: 1 via TOPICAL
  Filled 2019-01-18: qty 56

## 2019-01-18 MED ORDER — DIPHENHYDRAMINE HCL 50 MG/ML IJ SOLN: 12.5000 mg | INTRAMUSCULAR | Status: DC | PRN

## 2019-01-18 MED ORDER — LACTATED RINGERS IV SOLN
500.0000 mL | Freq: Once | INTRAVENOUS | Status: AC
  Administered 2019-01-18: 1000 mL via INTRAVENOUS

## 2019-01-18 NOTE — Progress Notes (Signed)
OB/GYN Faculty Practice: Labor Progress Note  Subjective: Patient doing well without complaints.  Objective: BP 115/71   Pulse (!) 102   Temp 98.8 F (37.1 C) (Oral)   Resp 16   Ht 5\' 5"  (1.651 m)   Wt 89 kg   LMP 05/03/2018 (Exact Date)   BMI 32.65 kg/m  Gen: well appearing, NAD Cervical exam: 4/50/-2  FHT: baseline 150bpm, mod variability, +accels, no decels Toco: q1-3 minutes  Assessment and Plan: 28 y.o. G1P0 [redacted]w[redacted]d presenting for IOL-ICP.  Labor: progressing well. FB dislodged since last exam. Will initiate pit at this time and AROM when able. Continue time appropriate cervical exams. -- pain control: doing well -- PPH Risk: low  Fetal Well-Being:  -- Category 1 - continuous fetal monitoring  -- GBS negative  ICP --doing well, no pruritis reported   Gestational thrombocytopenia --cbc on admit showed platelets=136 --no bleeding, bruising/petichae  Corliss Blacker, PGY-III Family Medicine 1:00 AM

## 2019-01-18 NOTE — Progress Notes (Addendum)
   Dorothy Olsen is a 28 y.o. G1P0 at [redacted]w[redacted]d  admitted for IOL for intrahepatic cholestasis of pregnancy. She also has gestational hypertension.   Subjective: Patient coping well; does not want epidural yet. Bag of waters still intact.   Objective: Vitals:   01/18/19 0837 01/18/19 0905 01/18/19 0949 01/18/19 1032  BP: 118/76 124/70 122/69 128/65  Pulse: 90 95 94 93  Resp: 16 16 15 18   Temp:      TempSrc:      Weight:      Height:       No intake/output data recorded.  FHT:  FHR: 140 bpm, variability: moderate,  accelerations:  Present,  decelerations:  Absent UC:   irregular, every 5 minutes SVE:   Dilation: 5 Effacement (%): 70 Station: -2, -3 Exam by:: Craig Guess, RN Pitocin @ 16 mu/min  Labs: Lab Results  Component Value Date   WBC 12.1 (H) 01/18/2019   HGB 11.5 (L) 01/18/2019   HCT 35.5 (L) 01/18/2019   MCV 87.7 01/18/2019   PLT 138 (L) 01/18/2019    Assessment / Plan: Doing well; feeling a little discouraged but wants to keep going without epidural.  Platelets are 138.  Plan for AROM at 1200 if no cervical change.  Labor: Progressing normally Fetal Wellbeing:  Category I Pain Control:  Labor support without medications Anticipated MOD:  NSVD  Dorothy Olsen 01/18/2019, 10:41 AM

## 2019-01-18 NOTE — Progress Notes (Signed)
   Dorothy Olsen is a 28 y.o. G1P0 at [redacted]w[redacted]d  admitted for induction of labor due to IOL for chronic hypertension now with pre-e with superimposed pre-eclampsia.   Subjective: Patient very uncomfortable; tearful.   Objective: Vitals:   01/18/19 1105 01/18/19 1106 01/18/19 1144 01/18/19 1208  BP: 113/64     Pulse: 85     Resp:  16 16 16   Temp:      TempSrc:      Weight:      Height:       No intake/output data recorded.  FHT:  FHR: 145 bpm, variability: moderate,  accelerations:  Present,  decelerations:  Absent UC:   irregular, every 2-3 minutes SVE:   Dilation: 5 Effacement (%): 70 Station: -2, -3 Exam by:: Mirranda Monrroy, cnm Pitocin @ 16 mu/min  Labs: Lab Results  Component Value Date   WBC 12.1 (H) 01/18/2019   HGB 11.5 (L) 01/18/2019   HCT 35.5 (L) 01/18/2019   MCV 87.7 01/18/2019   PLT 138 (L) 01/18/2019    Patient Vitals for the past 24 hrs:  BP Temp Temp src Pulse Resp  01/18/19 1208 - - - - 16  01/18/19 1144 - - - - 16  01/18/19 1106 - - - - 16  01/18/19 1105 113/64 - - 85 -  01/18/19 1032 128/65 - - 93 18  01/18/19 0949 122/69 - - 94 15  01/18/19 0905 124/70 - - 95 16  01/18/19 0837 118/76 - - 90 16  01/18/19 0804 117/76 - - 93 18  01/18/19 0733 121/74 - - 94 16  01/18/19 0701 123/76 - - 91 16  01/18/19 0638 128/73 - - 83 16  01/18/19 0431 (!) 107/49 - - (!) 106 16  01/18/19 0401 117/66 98.5 F (36.9 C) Oral (!) 119 16  01/18/19 0331 121/74 - - 89 16  01/18/19 0201 114/70 - - 87 16  01/18/19 0101 118/67 - - 87 16  01/18/19 0000 (!) 103/56 98.6 F (37 C) Oral 100 16  01/17/19 2300 115/71 - - (!) 102 16  01/17/19 2200 99/61 - - 93 16  01/17/19 2100 123/75 - - (!) 103 16  01/17/19 2055 127/78 - - 91 16  01/17/19 2000 - 97.9 F (36.6 C) Oral - -  01/17/19 1833 103/64 98.8 F (37.1 C) Oral (!) 102 16  01/17/19 1800 108/78 - - 82 16  01/17/19 1619 130/64 - - 100 18     Assessment / Plan: Patient has now SROM, still 5 cm.  Recommended epidural as  patient is very uncomfortable but patient would like to try IV pain medicine first.  -BPs are normal, no signs of pre-e.   Labor: Progressing normally Fetal Wellbeing:  Category I Pain Control:  IV pain meds Anticipated MOD:  NSVD  Dorothy Olsen 01/18/2019, 12:37 PM

## 2019-01-18 NOTE — Progress Notes (Signed)
OB/GYN Faculty Practice: Labor Progress Note  Subjective: Patient doing well without complaints.  Objective: BP 117/66   Pulse (!) 119   Temp 98.8 F (37.1 C) (Oral)   Resp 16   Ht 5\' 5"  (1.651 m)   Wt 89 kg   LMP 05/03/2018 (Exact Date)   BMI 32.65 kg/m  Gen: well appearing, NAD Dilation: 4 Effacement (%): 50 Cervical Position: Posterior Station: -2 Presentation: Vertex Exam by:: Dr. Sylvester Harder  FHT: baseline 150bpm, mod variability, +accels, no decels Toco: q1-3 minutes  Assessment and Plan: 28 y.o. G1P0 [redacted]w[redacted]d presenting for IOL-ICP.  Labor: s/p cytotec x3 and FB. progressing well. Pit @10 , will AROM when able. Continue time appropriate cervical exams. -- pain control: doing well, declines epidural -- PPH Risk: low  Fetal Well-Being:  -- Category 1 - continuous fetal monitoring  -- GBS negative  ICP --doing well, no pruritis reported   Gestational thrombocytopenia --cbc on admit showed platelets=136 --no bleeding, bruising/petichae  Corliss Blacker, PGY-III Family Medicine 5:14 AM

## 2019-01-18 NOTE — Anesthesia Procedure Notes (Signed)
Epidural Patient location during procedure: OB Start time: 01/18/2019 1:24 PM End time: 01/18/2019 1:29 PM  Staffing Anesthesiologist: Janeece Riggers, MD  Preanesthetic Checklist Completed: patient identified, site marked, surgical consent, pre-op evaluation, timeout performed, IV checked, risks and benefits discussed and monitors and equipment checked  Epidural Patient position: sitting Prep: site prepped and draped and DuraPrep Patient monitoring: continuous pulse ox and blood pressure Approach: midline Location: L3-L4 Injection technique: LOR air  Needle:  Needle type: Tuohy  Needle gauge: 17 G Needle length: 9 cm and 9 Needle insertion depth: 5 cm cm Catheter type: closed end flexible Catheter size: 19 Gauge Catheter at skin depth: 10 cm Test dose: negative  Assessment Events: blood not aspirated, injection not painful, no injection resistance, negative IV test and no paresthesia

## 2019-01-18 NOTE — Anesthesia Preprocedure Evaluation (Signed)
Anesthesia Evaluation  Patient identified by MRN, date of birth, ID band Patient awake    Reviewed: Allergy & Precautions, H&P , NPO status , Patient's Chart, lab work & pertinent test results, reviewed documented beta blocker date and time   Airway Mallampati: II  TM Distance: >3 FB Neck ROM: full    Dental no notable dental hx.    Pulmonary neg pulmonary ROS,    Pulmonary exam normal breath sounds clear to auscultation       Cardiovascular negative cardio ROS Normal cardiovascular exam Rhythm:regular Rate:Normal     Neuro/Psych negative neurological ROS  negative psych ROS   GI/Hepatic negative GI ROS, Neg liver ROS,   Endo/Other  negative endocrine ROS  Renal/GU negative Renal ROS  negative genitourinary   Musculoskeletal   Abdominal   Peds  Hematology negative hematology ROS (+)   Anesthesia Other Findings   Reproductive/Obstetrics (+) Pregnancy                             Anesthesia Physical Anesthesia Plan  ASA: III  Anesthesia Plan: Epidural   Post-op Pain Management:    Induction:   PONV Risk Score and Plan: 2 and Ondansetron  Airway Management Planned:   Additional Equipment:   Intra-op Plan:   Post-operative Plan:   Informed Consent: I have reviewed the patients History and Physical, chart, labs and discussed the procedure including the risks, benefits and alternatives for the proposed anesthesia with the patient or authorized representative who has indicated his/her understanding and acceptance.       Plan Discussed with: Anesthesiologist  Anesthesia Plan Comments:         Anesthesia Quick Evaluation

## 2019-01-18 NOTE — Discharge Summary (Signed)
Postpartum Discharge Summary     Patient Name: Dorothy Olsen DOB: [birth year] MRN: [id number]  Date of admission: 01/17/2019 Delivering Provider: Merilyn Baba   Date of discharge: 01/19/2019  Admitting diagnosis: pregnancy Intrauterine pregnancy: [redacted]w[redacted]d     Secondary diagnosis:  Active Problems:   Cholestasis of pregnancy   NSVD (normal spontaneous vaginal delivery)  Additional problems: gestational thrombocytopenia     Discharge diagnosis: Term Pregnancy Delivered                                                                                                Post partum procedures:none  Augmentation: Pitocin, Cytotec and Foley Balloon  Complications: None  Hospital course:  Induction of Labor With Vaginal Delivery   28 y.o. yo G1P0 at [redacted]w[redacted]d was admitted to the hospital 01/17/2019 for induction of labor.  Indication for induction: Cholestasis of pregnancy.  Patient had an uncomplicated labor course as follows: Membrane Rupture Time/Date: 12:08 PM ,01/18/2019   Intrapartum Procedures: Episiotomy: None [1]                                         Lacerations:  2nd degree [3];Perineal [11]  Patient had delivery of a Viable infant.  Information for the patient's newborn:  Shiann, Kam [[id number]]      01/18/2019  Details of delivery can be found in separate delivery note.  Patient had a routine postpartum course. Patient is discharged home 01/19/19.  Magnesium Sulfate recieved: No BMZ received: No  Physical exam  Vitals:   01/18/19 1954 01/18/19 2328 01/19/19 0219 01/19/19 0548  BP: 110/67 132/73 131/87 132/74  Pulse: 86 95 77 89  Resp: 18 18 18 18   Temp: 98.7 F (37.1 C) 99 F (37.2 C) 98.2 F (36.8 C) 98.2 F (36.8 C)  TempSrc: Oral Oral Oral Oral  SpO2: 100% 97% 100% 100%  Weight:      Height:       General: alert, cooperative and no distress Lochia: appropriate Uterine Fundus: firm Incision: N/A DVT Evaluation: No evidence of DVT seen on physical  exam. Labs: Lab Results  Component Value Date   WBC 12.1 (H) 01/19/2019   HGB 9.8 (L) 01/19/2019   HCT 30.3 (L) 01/19/2019   MCV 87.3 01/19/2019   PLT 122 (L) 01/19/2019   CMP Latest Ref Rng & Units 11/08/2018  Glucose 65 - 99 mg/dL 76  BUN 6 - 20 mg/dL 5(L)  Creatinine 0.57 - 1.00 mg/dL 0.49(L)  Sodium 134 - 144 mmol/L 139  Potassium 3.5 - 5.2 mmol/L 5.1  Chloride 96 - 106 mmol/L 105  CO2 20 - 29 mmol/L 19(L)  Calcium 8.7 - 10.2 mg/dL 9.1  Total Protein 6.0 - 8.5 g/dL 5.8(L)  Total Bilirubin 0.0 - 1.2 mg/dL <0.2  Alkaline Phos 39 - 117 IU/L 86  AST 0 - 40 IU/L 26  ALT 0 - 32 IU/L 31    Discharge instruction: per After Visit Summary and "Baby and Me Booklet".  After visit meds:  Allergies as of 01/19/2019   No Known Allergies     Medication List    STOP taking these medications   hydrocortisone 2.5 % rectal cream Commonly known as: Anusol-HC   ursodiol 300 MG capsule Commonly known as: Actigall     TAKE these medications   ibuprofen 600 MG tablet Commonly known as: ADVIL Take 1 tablet (600 mg total) by mouth every 6 (six) hours.   Prenatal Vitamins 28-0.8 MG Tabs Take 1 tablet by mouth daily.       Diet: routine diet  Activity: Advance as tolerated. Pelvic rest for 6 weeks.   Outpatient follow up:4 weeks Follow up Appt:No future appointments. Follow up Visit: Follow-up Information    [facility] Follow up.   Specialty: Obstetrics and Gynecology Why: As scheduled at 4-6 weeks for postpartum visit Contact information: [address] [place] [address] [facility] [place] [id number] [phone]           Please schedule this patient for Postpartum visit in: 6 weeks with the following provider: Any provider For C/S patients schedule nurse incision check in weeks 2 weeks: no High risk pregnancy complicated by: ICP, gestational thrombocytopenia Delivery mode:  SVD Anticipated Birth Control:  POPs PP Procedures needed: repeat Pap   Schedule Integrated BH visit: no      Newborn Data: Live born female  Birth Weight:   APGAR: 9, 9  Newborn Delivery   Birth date/time: 01/18/2019 17:07:00 Delivery type: Vaginal, Spontaneous      Baby Feeding: Breast Disposition:home with mother   01/19/2019 Sharen CounterLisa Leftwich-Kirby, CNM

## 2019-01-19 LAB — CBC
HCT: 30.3 % — ABNORMAL LOW (ref 36.0–46.0)
Hemoglobin: 9.8 g/dL — ABNORMAL LOW (ref 12.0–15.0)
MCH: 28.2 pg (ref 26.0–34.0)
MCHC: 32.3 g/dL (ref 30.0–36.0)
MCV: 87.3 fL (ref 80.0–100.0)
Platelets: 122 10*3/uL — ABNORMAL LOW (ref 150–400)
RBC: 3.47 MIL/uL — ABNORMAL LOW (ref 3.87–5.11)
RDW: 14.3 % (ref 11.5–15.5)
WBC: 12.1 10*3/uL — ABNORMAL HIGH (ref 4.0–10.5)
nRBC: 0 % (ref 0.0–0.2)

## 2019-01-19 MED ORDER — IBUPROFEN 600 MG PO TABS: 600.0000 mg | ORAL_TABLET | Freq: Four times a day (QID) | ORAL | 0 refills | Status: DC

## 2019-01-19 MED ORDER — MEDROXYPROGESTERONE ACETATE 150 MG/ML IM SUSP
150.0000 mg | Freq: Once | INTRAMUSCULAR | Status: AC
  Administered 2019-01-20: 150 mg via INTRAMUSCULAR
  Filled 2019-01-19: qty 1

## 2019-01-19 NOTE — Anesthesia Postprocedure Evaluation (Signed)
Anesthesia Post Note  Patient: Dorothy Olsen  Procedure(s) Performed: AN AD [place]     Patient location during evaluation: Mother Baby Anesthesia Type: Epidural Level of consciousness: awake Pain management: satisfactory to patient Vital Signs Assessment: post-procedure vital signs reviewed and stable Respiratory status: spontaneous breathing Cardiovascular status: stable Anesthetic complications: no    Last Vitals:  Vitals:   01/19/19 0219 01/19/19 0548  BP: 131/87 132/74  Pulse: 77 89  Resp: 18 18  Temp: 36.8 C 36.8 C  SpO2: 100% 100%    Last Pain:  Vitals:   01/19/19 0548  TempSrc: Oral  PainSc: 2    Pain Goal:                   Casimer Lanius

## 2019-01-19 NOTE — Lactation Note (Signed)
This note was copied from a baby's chart. Lactation Consultation Note  Patient Name: Dorothy Olsen UXNAT'F Date: 01/19/2019 Reason for consult: 1st time breastfeeding;Initial assessment;Difficult latch;Early term 37-38.6wks P1, 8 hour, ETI infant less than 6 lbs, DAT+ and grunting. Per mom, infant did not latch in L&D. Mom made two attempts and infant latched less than 2 minutes would not sustain latch. Night Nurse gave mom DEBP and mom pumped once. [place] alert nurse of infant grunting. After multiple attempts and infant not sustaining latch, infant was  off and on breast, mom latched infant on left breast using cross cradle hold with 20 mm NS. Infant start sustaining latch and breastfeed for 15 minutes. Mom will continue to work towards latching infant on breast. Mom will attempt to latch infant without NS. Mom will continue to use DEBP every 2 to 3 hours. LC discussed possible supplementation due infant 's small size and beng DAT+ mom wants to supplement  with her EBM only. Parents will continue to do as much STS as possible. Mom knows to call Nurse or LC if she has any further questions, concerns or needs assistance with latching infant to breast.  Reviewed Baby & Me book's Breastfeeding Basics.  Mom made aware of O/P services, breastfeeding support groups, community resources, and our phone # for post-discharge questions.  Maternal Data Formula Feeding for Exclusion: No Has patient been taught Hand Expression?: Yes(Infant given 1 ml of EBM)  Feeding Feeding Type: Breast Milk  LATCH Score Latch: Repeated attempts needed to sustain latch, nipple held in mouth throughout feeding, stimulation needed to elicit sucking reflex.  Audible Swallowing: A few with stimulation  Type of Nipple: Everted at rest and after stimulation  Comfort (Breast/Nipple): Soft / non-tender  Hold (Positioning): Assistance needed to correctly position infant at breast and maintain latch.  LATCH Score:  7  Interventions Interventions: Breast feeding basics reviewed;Breast compression;Adjust position;Assisted with latch;DEBP;Support pillows;Skin to skin;Position options;Breast massage;Hand express;Expressed milk  Lactation Tools Discussed/Used Pump Review: Setup, frequency, and cleaning Initiated by:: by Nurse Date initiated:: 01/18/19   Consult Status Consult Status: Follow-up Date: 01/19/19 Follow-up type: In-patient    Vicente Serene 01/19/2019, 2:01 AM

## 2019-01-19 NOTE — Lactation Note (Signed)
This note was copied from a baby's chart. Lactation Consultation Note  Patient Name: Dorothy Olsen Today's Date: 01/19/2019    P1, 28 hour ETI female infant,  less than 6 lbs, weight loss -2%, DAT+ on triple phototherapy. Mom is now supplementing with Similac Neosure 22 kcal formula. Mom much calmer and  understands importance of photherapy and infant being supplement with formula at this time after breastfeeding infant. Mom wants to try the SNS at next feeding with infant. Mom gave infant formula with curve tip syringe  at 9:44 pm but did not latch infant to breast. Per mom, she will start using DEBP every 3 hours, she has pumped 2 times today. Mom will call LC to assist with next feeding .      Maternal Data    Feeding Feeding Type: Formula  LATCH Score                   Interventions    Lactation Tools Discussed/Used     Consult Status      Vicente Serene 01/19/2019, 9:56 PM

## 2019-01-20 ENCOUNTER — Encounter (HOSPITAL_COMMUNITY): Payer: Self-pay | Admitting: *Deleted

## 2019-01-20 ENCOUNTER — Ambulatory Visit: Payer: Self-pay

## 2019-01-20 MED ORDER — FENTANYL-BUPIVACAINE-NACL 0.5-0.125-0.9 MG/250ML-% EP SOLN: 12.0000 mL/h | EPIDURAL | Status: DC | PRN

## 2019-01-20 MED ORDER — DIPHENHYDRAMINE HCL 50 MG/ML IJ SOLN: 12.5000 mg | INTRAMUSCULAR | Status: DC | PRN

## 2019-01-20 MED ORDER — TERBUTALINE SULFATE 1 MG/ML IJ SOLN
0.2500 mg | Freq: Once | INTRAMUSCULAR | Status: DC | PRN
  Filled 2019-01-20: qty 1

## 2019-01-20 MED ORDER — OXYTOCIN 40 UNITS IN NORMAL SALINE INFUSION - SIMPLE MED: 1.0000 m[IU]/min | INTRAVENOUS | Status: DC

## 2019-01-20 NOTE — Discharge Summary (Signed)
Postpartum Discharge Summary     Patient Name: Dorothy Olsen DOB: [birth year] MRN: [id number]  Date of admission: 01/17/2019 Delivering Provider: Merilyn Baba   Date of discharge: 01/20/2019  Admitting diagnosis: pregnancy Intrauterine pregnancy: [redacted]w[redacted]d     Secondary diagnosis:  Active Problems:   Cholestasis of pregnancy, antepartum   Gestational thrombocytopenia ([place])   Cholestasis of pregnancy   NSVD (normal spontaneous vaginal delivery)  Additional problems: gestational thrombocytopenia     Discharge diagnosis: Term Pregnancy Delivered                                                                                                Post partum procedures:none  Augmentation: Pitocin, Cytotec and Foley Balloon  Complications: None  Hospital course:  Induction of Labor With Vaginal Delivery   28 y.o. yo G1P0 at [redacted]w[redacted]d was admitted to the hospital 01/17/2019 for induction of labor.  Indication for induction: Cholestasis of pregnancy.  Patient had an uncomplicated labor course as follows: Membrane Rupture Time/Date: 12:08 PM ,01/18/2019   Intrapartum Procedures: Episiotomy: None [1]                                         Lacerations:  2nd degree [3];Perineal [11]  Patient had delivery of a Viable infant.  Information for the patient's newborn:  Seaira, [name]      01/18/2019  Details of delivery can be found in separate delivery note.  Patient had a routine postpartum course. Patient is discharged home 01/20/19.  Magnesium Sulfate recieved: No BMZ received: No  Physical exam  Vitals:   01/19/19 0219 01/19/19 0548 01/19/19 2155 01/20/19 0559  BP: 131/87 132/74 123/80 116/69  Pulse: 77 89 75 69  Resp: 18 18 18 18   Temp: 98.2 F (36.8 C) 98.2 F (36.8 C) 98.2 F (36.8 C) 98.4 F (36.9 C)  TempSrc: Oral Oral Axillary Axillary  SpO2: 100% 100%    Weight:      Height:       General: alert, cooperative and no distress Lochia: appropriate Uterine  Fundus: firm Incision: N/A DVT Evaluation: No evidence of DVT seen on physical exam. Labs: Lab Results  Component Value Date   WBC 12.1 (H) 01/19/2019   HGB 9.8 (L) 01/19/2019   HCT 30.3 (L) 01/19/2019   MCV 87.3 01/19/2019   PLT 122 (L) 01/19/2019   CMP Latest Ref Rng & Units 11/08/2018  Glucose 65 - 99 mg/dL 76  BUN 6 - 20 mg/dL 5(L)  Creatinine 0.57 - 1.00 mg/dL 0.49(L)  Sodium 134 - 144 mmol/L 139  Potassium 3.5 - 5.2 mmol/L 5.1  Chloride 96 - 106 mmol/L 105  CO2 20 - 29 mmol/L 19(L)  Calcium 8.7 - 10.2 mg/dL 9.1  Total Protein 6.0 - 8.5 g/dL 5.8(L)  Total Bilirubin 0.0 - 1.2 mg/dL <0.2  Alkaline Phos 39 - 117 IU/L 86  AST 0 - 40 IU/L 26  ALT 0 - 32 IU/L 31    Discharge instruction: per After  Visit Summary and "Baby and Me Booklet".  After visit meds:  Allergies as of 01/20/2019   No Known Allergies     Medication List    STOP taking these medications   hydrocortisone 2.5 % rectal cream Commonly known as: Anusol-HC   ursodiol 300 MG capsule Commonly known as: Actigall     TAKE these medications   ibuprofen 600 MG tablet Commonly known as: ADVIL Take 1 tablet (600 mg total) by mouth every 6 (six) hours.   Prenatal Vitamins 28-0.8 MG Tabs Take 1 tablet by mouth daily.       Diet: routine diet  Activity: Advance as tolerated. Pelvic rest for 6 weeks.   Outpatient follow up:4 weeks Follow up Appt:No future appointments. Follow up Visit: Follow-up Information    [facility] Follow up.   Specialty: Obstetrics and Gynecology Why: As scheduled at 4-6 weeks for postpartum visit Contact information: [address] [place] [address] [facility] [place] [phone]           Please schedule this patient for Postpartum visit in: 6 weeks with the following provider: Any provider For C/S patients schedule nurse incision check in weeks 2 weeks: no High risk pregnancy complicated by: ICP, gestational thrombocytopenia Delivery  mode:  SVD Anticipated Birth Control:  POPs PP Procedures needed: repeat Pap  Schedule Integrated BH visit: no      Newborn Data: Live born female  Birth Weight:   APGAR: 9, 9  Newborn Delivery   Birth date/time: 01/18/2019 17:07:00 Delivery type: Vaginal, Spontaneous      Baby Feeding: Breast Disposition:home with mother   Mart PiggsAlicia Firestone, MD  PGY-3   OB FELLOW DISCHARGE ATTESTATION  I have seen and examined this patient and agree with above documentation in the resident's note.   Marcy Sirenatherine Wallace, D.O. OB Fellow  01/20/2019, 8:53 AM

## 2019-01-20 NOTE — Lactation Note (Signed)
This note was copied from a baby's chart. Lactation Consultation Note  Patient Name: Boy Brindy Higginbotham Today's Date: 01/20/2019   P1, 31 hour ETI female infant, 2%, DAT+ on phototherapy. Infant had 4 stool and 2 voids. Infant would not suckle at breast with SNS system at this time. Per dad, infant will not take formula from yellow slow flow nipple or curve tip syringe. Infant latched for  7 minutes with 20 mm NS and took 12 ml of Similac Neosure 22 kcal  with iron  by purple slow flow nipple.  Mom's plan: 1. Latch infant to breast with 20 mm NS and then supplement infant with EBM or formula based on infant age/ hours of life. 2. Mom will make attempts to latch infant without NS throughout the day. 3. Mom will use DEBP every 3 hours for 15 minutes on initial setting.   Maternal Data    Feeding Feeding Type: Formula Nipple Type: Nfant Slow Flow (purple)  LATCH Score Latch: Grasps breast easily, tongue down, lips flanged, rhythmical sucking.  Audible Swallowing: A few with stimulation  Type of Nipple: Everted at rest and after stimulation  Comfort (Breast/Nipple): Soft / non-tender  Hold (Positioning): Assistance needed to correctly position infant at breast and maintain latch.  LATCH Score: 8  Interventions Interventions: Adjust position;Support pillows;Position options;Expressed milk  Lactation Tools Discussed/Used Tools: Nipple Shields Nipple shield size: 20   Consult Status Consult Status: Follow-up Date: 01/20/19 Follow-up type: In-patient    Vicente Serene 01/20/2019, 12:56 AM

## 2019-01-20 NOTE — Lactation Note (Signed)
This note was copied from a baby's chart. Lactation Consultation Note: Mother paged for [facility] assistance . Mother doing STS when I arrived in the room.  Reviewed different positions with mother. Infant was placed in football hold. Lots of assistance in bringing mother to hold infant close and hold her breast.  Expressed large amts of colostrum on infants lips. Infant began cuing and opened his mouth , she tugged on the nipple shaft but no sustained latch.  Attempt to rouse infant with gloved finger. Infant suckles strong on gloved finger. Infant then latched on and off with observed burst of suckles and a few swallows.  Infant  latched on with a #24 NS. NS was pre-filled with formula.  Infant tugged with burst of suckles.  Advised mother to continue to attempt to breastfeed infant, before each bottle feeding. Continue to offer ebm/formula with a purple bottle nipple.   Suggested that mother pump for 15 mins while father bottle feeds infant.  Infant took 15 ml of formula.    Patient Name: Boy Muskan Bolla JOINO'M Date: 01/20/2019     Maternal Data    Feeding Feeding Type: Bottle Fed - Formula Nipple Type: Extra Slow Flow  LATCH Score                   Interventions    Lactation Tools Discussed/Used     Consult Status      [name] 01/20/2019, 4:17 PM

## 2019-01-20 NOTE — Discharge Instructions (Signed)

## 2019-01-20 NOTE — Lactation Note (Signed)
This note was copied from a baby's chart. Lactation Consultation Note:  Mother reports that she pumped breast this am and didn't get any thing.  [place] worked with mother again on hand expression. Observed large drops of colostrum.  Advised mother to do frequent hand expression before and after breastfeeding and pumping.  Advised mother to pump for 15-20 mins and then page [facility] for feeding assessment when infant is ready for next feeding.   Mother reports that she doesn't have an electric pump at home. She is not active with WIC. Discussed plans to get an electric pump. Mother reports that she has not considered the need for a pump.   Discussed ETI behavior and that there is a need to protect her milk supply while infant is continuing to grow and learn to feed.   [place] referral was sent to [facility]. Staff nurse to [facility] to give mother a hand pump.   Patient Name: Dorothy Olsen ZWCHE'N Date: 01/20/2019     Maternal Data    Feeding Feeding Type: Bottle Fed - Formula Nipple Type: Extra Slow Flow  LATCH Score                   Interventions    Lactation Tools Discussed/Used     Consult Status      Dorothy Olsen 01/20/2019, 4:09 PM

## 2019-01-21 ENCOUNTER — Ambulatory Visit: Payer: Self-pay

## 2019-01-21 NOTE — Lactation Note (Addendum)
This note was copied from a baby's chart. Lactation Consultation Note  Patient Name: Dorothy Olsen CMKLK'J Date: 01/21/2019 Reason for consult: Follow-up assessment;Early term 37-38.6wks;Infant < 6lbs;Hyperbilirubinemia;1st time breastfeeding;Primapara +DAT  LC in to visit with P1 Mom of ET infant weighing <6 lbs.  Baby at 5% weight loss.  Has been on phototherapy, dc'd yesterday.  Bilirubin level this am increased to 11.2.    Mom holding baby STS in the sunny window.  Baby noted to have several jerking movements of arms and legs on his left side.  Placed baby against Mom's chest more and this movement was less, but still noticed subtle jerking.  RN aware.  Mom is pumping regularly and obtaining 5-10 ml of EBM now.  Praised Mom for her hard work.  Baby taking 30-40 ml per feeding now, or 22 cal formula by bottle. Mom attempting breast first and then supplementing baby.  Talked about normal sleepiness with jaundice, being ET, and being under 6 lbs.  Mom understands.  Mom ordered an off brand double pump from [employer].  FOB is applying for Medicaid due to losing his job and insurance because of Covid.  Mom is interested in obtaining [place].  Faxed request to [facility], asking whether Mom could sign up over phone, and then qualify for DEBP at discharge.    Mom to call prn for assistance.  Interventions Interventions: Skin to skin;Breast massage;Hand express;DEBP;Expressed milk  Lactation Tools Discussed/Used Tools: Pump;Bottle Breast pump type: Double-Electric Breast Pump   Consult Status Consult Status: Follow-up Date: 01/22/19 Follow-up type: [place] 01/21/2019, 12:56 PM

## 2019-01-22 ENCOUNTER — Ambulatory Visit: Payer: Self-pay

## 2019-01-22 NOTE — Lactation Note (Signed)
This note was copied from a baby's chart. Lactation Consultation Note  Patient Name: Dorothy Olsen RUEAV'W Date: 01/22/2019 Reason for consult: NICU baby;Early term 37-38.6wks;Infant < 6lbs;Other (Comment)(LC was asked to see this mom due to breast feeding questions she has)  [name] is 37 days old .  Mom and dad had breast feeding questions.  Mom mentioned the most she has pumped off at 1 pumping is 20 ml .  LC reviewed supply and demand / mentioned to mom mature milk comes in 3-5 days and its a up and down progress.  Per mom has only been pumping 6 times a day both breast with a DEBP.  LC recommended the importance of increasing her pumping around the clock  At least 8 times a day and one power pump over 60 mins ( 2 options 10 mins on off over 60 mins or 20 mins on 10 mins off over 60 mins ) and hand expressing.  Mom mentioned she was given a NS and it doesn't seem to stay on well.  LC offered to check and noted the #24 NS is a good fit and showed mom  How to apply it.  Mom has erect nipples and recommended latching without it and if the baby is  Fussy to latch - give an appetizer 1st 10 ml and then latch with firm support.  Mom and dad are ready to go home so LC unable to see a feeding.  LC recommended when visiting tomorrow have her RN call to set up a time for a feeding assessment with LC .  LC encouraged to start the one power pump tonight.  And get up at least once during the night to pump. 8-10 times in 24 hours.    Maternal Data Has patient been taught Hand Expression?: Yes  Feeding Feeding Type: Breast Milk Nipple Type: Nfant Slow Flow (purple)  LATCH Score                   Interventions Interventions: Breast feeding basics reviewed  Lactation Tools Discussed/Used Tools: Pump Nipple shield size: 24(mom requested the LC recheck the sizing of the NS , the #24 NS is a good fit , mom able to reapply it) Breast pump type: Double-Electric Breast  Pump   Consult Status Consult Status: PRN Date: (baby in NICU) Follow-up type: In-patient    [place] 01/22/2019, 6:38 PM

## 2019-02-28 ENCOUNTER — Encounter: Payer: Self-pay | Admitting: Obstetrics and Gynecology

## 2019-02-28 ENCOUNTER — Other Ambulatory Visit: Payer: Self-pay

## 2019-02-28 ENCOUNTER — Ambulatory Visit (INDEPENDENT_AMBULATORY_CARE_PROVIDER_SITE_OTHER): Payer: Medicaid Other | Admitting: Obstetrics and Gynecology

## 2019-02-28 VITALS — BP 110/75 | HR 97 | Temp 98.6°F | Ht 65.5 in | Wt 181.0 lb

## 2019-02-28 DIAGNOSIS — Z8759 Personal history of other complications of pregnancy, childbirth and the puerperium: Secondary | ICD-10-CM

## 2019-02-28 DIAGNOSIS — Z8719 Personal history of other diseases of the digestive system: Secondary | ICD-10-CM

## 2019-02-28 NOTE — Progress Notes (Signed)
   Post Partum Exam  Dorothy Olsen is a 28 y.o. G1P0 female who presents for a postpartum visit. She is 6 weeks postpartum following a spontaneous vaginal delivery. I have fully reviewed the prenatal and intrapartum course. The delivery was at [redacted]w[redacted]d gestational weeks.  Anesthesia: epidural. Postpartum course has been uncomplicated. Baby's course has been uncomplicated. Baby is feeding by breast. Bleeding no bleeding. Bowel function is normal. Bladder function is normal. Patient is not sexually active. Contraception method is Depo-Provera injections. Postpartum depression screening:neg=4  The following portions of the patient's history were reviewed and updated as appropriate: allergies, current medications, past family history, past medical history, past social history, past surgical history and problem list. Last pap smear done 08/16/2018 and was Normal with absent endocervical/transformation zone  Review of Systems Constitutional: negative Eyes: negative Ears, nose, mouth, throat, and face: negative Respiratory: negative Cardiovascular: negative Gastrointestinal: negative Genitourinary:negative Integument/breast: negative Hematologic/lymphatic: negative Musculoskeletal:negative Neurological: negative Behavioral/Psych: negative Endocrine: negative Allergic/Immunologic: negative    Objective:  Blood pressure 110/75, pulse 97, temperature 98.6 F (37 C), temperature source Oral, height 5' 5.5" (1.664 m), weight 181 lb (82.1 kg), last menstrual period 05/03/2018, currently breastfeeding.  General:  alert, cooperative and no distress   Breasts:  inspection negative, no nipple discharge or bleeding, no masses or nodularity palpable  Lungs: clear to auscultation bilaterally  Heart:  regular rate and rhythm, S1, S2 normal, no murmur, click, rub or gallop  Abdomen: soft, non-tender; bowel sounds normal; no masses,  no organomegaly   Vulva:  not evaluated  Vagina: not evaluated  Cervix:   not evaluated  Corpus: not examined  Adnexa:  not evaluated  Rectal Exam: Not performed.        Assessment:    Normal postpartum exam. Pap smear not done at today's visit.   Plan:   1. Contraception: Depo-Provera injections 2. According to ASCCP management guidelines, pap will be repeated in 3 years 3. Follow up in: 2 months for Depo injections or as needed.  4. Return to work as planned on 03/11/2019.  Laury Deep, CNM

## 2019-03-01 LAB — COMPREHENSIVE METABOLIC PANEL
ALT: 35 IU/L — ABNORMAL HIGH (ref 0–32)
AST: 26 IU/L (ref 0–40)
Albumin/Globulin Ratio: 1.5 (ref 1.2–2.2)
Albumin: 4.4 g/dL (ref 3.9–5.0)
Alkaline Phosphatase: 70 IU/L (ref 39–117)
BUN/Creatinine Ratio: 12 (ref 9–23)
BUN: 10 mg/dL (ref 6–20)
Bilirubin Total: 0.3 mg/dL (ref 0.0–1.2)
CO2: 22 mmol/L (ref 20–29)
Calcium: 9.3 mg/dL (ref 8.7–10.2)
Chloride: 108 mmol/L — ABNORMAL HIGH (ref 96–106)
Creatinine, Ser: 0.83 mg/dL (ref 0.57–1.00)
GFR calc Af Amer: 111 mL/min/{1.73_m2} (ref >59)
GFR calc non Af Amer: 96 mL/min/{1.73_m2} (ref >59)
Globulin, Total: 3 g/dL (ref 1.5–4.5)
Glucose: 85 mg/dL (ref 65–99)
Potassium: 4.7 mmol/L (ref 3.5–5.2)
Sodium: 143 mmol/L (ref 134–144)
Total Protein: 7.4 g/dL (ref 6.0–8.5)

## 2019-03-01 LAB — CBC
Hematocrit: 40.5 % (ref 34.0–46.6)
Hemoglobin: 13.3 g/dL (ref 11.1–15.9)
MCH: 27.6 pg (ref 26.6–33.0)
MCHC: 32.8 g/dL (ref 31.5–35.7)
MCV: 84 fL (ref 79–97)
Platelets: 205 10*3/uL (ref 150–450)
RBC: 4.82 x10E6/uL (ref 3.77–5.28)
RDW: 12.3 % (ref 11.7–15.4)
WBC: 5.2 10*3/uL (ref 3.4–10.8)

## 2019-03-02 ENCOUNTER — Encounter: Payer: Self-pay | Admitting: Obstetrics and Gynecology

## 2019-04-08 ENCOUNTER — Ambulatory Visit: Payer: Self-pay

## 2020-06-13 NOTE — L&D Delivery Note (Signed)
OB/GYN Faculty Practice Delivery Note  Dorothy Olsen is a 30 y.o. G2P1001 at [redacted]w[redacted]d. She was admitted for IOL due to cholestasis of pregnancy.   ROM: 1h 28m with clear fluid GBS Status: neg Maximum Maternal Temperature: 98.2*F  Labor Progress: Patient admitted for IOL due to cholestasis of pregnancy and received cytotec x3, pitocin, AROM and progressed to complete.  Delivery Date/Time: 01/27/21 at 1201 PM Delivery: Called to room and patient was complete and pushing on all fours. Head delivered direct OA. No nuchal cord present. Shoulder and body delivered in usual fashion. Infant with spontaneous cry, placed on mother's abdomen, dried and stimulated. Cord clamped x 2 and cut by Philipp Deputy, CMN. Cord blood drawn. Placenta delivered spontaneously with gentle cord traction. Fundus firm with massage and Pitocin. Labia, perineum, vagina, and cervix inspected with hemostatic perineal abrasion.   Placenta: intact, 3VC Complications: none Lacerations: none EBL: 300cc Analgesia: none  Postpartum Planning [ ]  message to sent to schedule follow-up  [ ]  vaccines UTD  Infant: viable female  APGARs 8,9  weight pending medical chart  , MD [facility], PGY-3 01/27/2021, 12:28 PM

## 2020-06-30 ENCOUNTER — Other Ambulatory Visit: Payer: Self-pay

## 2020-06-30 ENCOUNTER — Ambulatory Visit (INDEPENDENT_AMBULATORY_CARE_PROVIDER_SITE_OTHER): Payer: Medicaid Other | Admitting: *Deleted

## 2020-06-30 VITALS — BP 121/76 | HR 80 | Temp 98.2°F | Ht 66.0 in | Wt 188.4 lb

## 2020-06-30 DIAGNOSIS — Z3201 Encounter for pregnancy test, result positive: Secondary | ICD-10-CM

## 2020-06-30 DIAGNOSIS — Z3480 Encounter for supervision of other normal pregnancy, unspecified trimester: Secondary | ICD-10-CM

## 2020-06-30 LAB — POCT URINE PREGNANCY: Preg Test, Ur: POSITIVE — AB

## 2020-06-30 NOTE — Progress Notes (Signed)
   Location:[facility] Patient: Dorothy Olsen Provider: Clovis Pu, RN  PRENATAL INTAKE SUMMARY  Ms. Alkins presents today New OB Nurse Interview.  OB History    Gravida  2   Para  1   Term  1   Preterm      AB      Living  1     SAB      IAB      Ectopic      Multiple      Live Births  1          I have reviewed the patient's medical, obstetrical, social, and family histories, medications, and available lab results.  SUBJECTIVE She has no unusual complaints  OBJECTIVE Initial Nurse interview for history/labs (New OB)  EDD: 02/16/21 by LMP GA: [redacted]w[redacted]d G2P1001  GENERAL APPEARANCE: alert, well appearing, in no apparent distress, oriented to person, place and time   ASSESSMENT Positive UPT Normal pregnancy  PLAN Prenatal care:  [facility] Labs to be completed at next visit with Raelyn Mora, CNM 07/29/20 Ultrasound <14 weeks scheduled for 07/08/20  Follow Up Instructions:   I discussed the assessment and treatment plan with the patient. The patient was provided an opportunity to ask questions and all were answered. The patient agreed with the plan and demonstrated an understanding of the instructions.   The patient was advised to call back or seek an in-person evaluation if the symptoms worsen or if the condition fails to improve as anticipated.  I provided 20 minutes of  face-to-face time during this encounter.  Clovis Pu, RN

## 2020-07-08 ENCOUNTER — Ambulatory Visit
Admission: RE | Admit: 2020-07-08 | Discharge: 2020-07-08 | Disposition: A | Discharge status: Home / Self Care | Payer: Medicaid Other | Source: Ambulatory Visit | Attending: Obstetrics and Gynecology | Admitting: Obstetrics and Gynecology

## 2020-07-08 ENCOUNTER — Other Ambulatory Visit: Payer: Self-pay

## 2020-07-08 DIAGNOSIS — Z3480 Encounter for supervision of other normal pregnancy, unspecified trimester: Secondary | ICD-10-CM | POA: Diagnosis not present

## 2020-07-29 ENCOUNTER — Ambulatory Visit (INDEPENDENT_AMBULATORY_CARE_PROVIDER_SITE_OTHER): Payer: Medicaid Other | Admitting: Obstetrics and Gynecology

## 2020-07-29 ENCOUNTER — Other Ambulatory Visit (HOSPITAL_COMMUNITY)
Admission: RE | Admit: 2020-07-29 | Discharge: 2020-07-29 | Disposition: A | Discharge status: Home / Self Care | Payer: Medicaid Other | Source: Ambulatory Visit | Attending: Obstetrics and Gynecology | Admitting: Obstetrics and Gynecology

## 2020-07-29 ENCOUNTER — Encounter: Payer: Self-pay | Admitting: Obstetrics and Gynecology

## 2020-07-29 ENCOUNTER — Other Ambulatory Visit: Payer: Self-pay

## 2020-07-29 VITALS — BP 122/77 | HR 85 | Temp 98.0°F | Wt 185.0 lb

## 2020-07-29 DIAGNOSIS — Z3480 Encounter for supervision of other normal pregnancy, unspecified trimester: Secondary | ICD-10-CM

## 2020-07-29 DIAGNOSIS — Z8759 Personal history of other complications of pregnancy, childbirth and the puerperium: Secondary | ICD-10-CM

## 2020-07-29 DIAGNOSIS — Z8719 Personal history of other diseases of the digestive system: Secondary | ICD-10-CM

## 2020-07-29 DIAGNOSIS — Z3490 Encounter for supervision of normal pregnancy, unspecified, unspecified trimester: Secondary | ICD-10-CM | POA: Diagnosis present

## 2020-07-29 DIAGNOSIS — O23591 Infection of other part of genital tract in pregnancy, first trimester: Secondary | ICD-10-CM | POA: Diagnosis not present

## 2020-07-29 DIAGNOSIS — Z3A11 11 weeks gestation of pregnancy: Secondary | ICD-10-CM | POA: Diagnosis not present

## 2020-07-29 DIAGNOSIS — O219 Vomiting of pregnancy, unspecified: Secondary | ICD-10-CM | POA: Diagnosis not present

## 2020-07-29 DIAGNOSIS — Z01419 Encounter for gynecological examination (general) (routine) without abnormal findings: Secondary | ICD-10-CM | POA: Insufficient documentation

## 2020-07-29 DIAGNOSIS — B373 Candidiasis of vulva and vagina: Secondary | ICD-10-CM | POA: Diagnosis not present

## 2020-07-29 DIAGNOSIS — Z3491 Encounter for supervision of normal pregnancy, unspecified, first trimester: Secondary | ICD-10-CM | POA: Diagnosis present

## 2020-07-29 MED ORDER — METOCLOPRAMIDE HCL 10 MG PO TABS: 10.0000 mg | ORAL_TABLET | Freq: Four times a day (QID) | ORAL | 0 refills | Status: DC | PRN

## 2020-07-29 MED ORDER — ONDANSETRON 4 MG PO TBDP: 4.0000 mg | ORAL_TABLET | Freq: Three times a day (TID) | ORAL | 1 refills | Status: DC | PRN

## 2020-07-29 NOTE — Patient Instructions (Signed)
Morning Sickness  Morning sickness is when a woman feels nauseous during pregnancy. This nauseous feeling may or may not come with vomiting. It often occurs in the morning, but it can be a problem at any time of day. Morning sickness is most common during the first trimester. In some cases, it may continue throughout pregnancy. Although morning sickness is unpleasant, it is usually harmless unless the woman develops severe and continual vomiting (hyperemesis gravidarum), a condition that requires more intense treatment. What are the causes? The exact cause of this condition is not known, but it seems to be related to normal hormonal changes that occur in pregnancy. What increases the risk? You are more likely to develop this condition if:  You experienced nausea or vomiting before your pregnancy.  You had morning sickness during a previous pregnancy.  You are pregnant with more than one baby, such as twins. What are the signs or symptoms? Symptoms of this condition include:  Nausea.  Vomiting. How is this diagnosed? This condition is usually diagnosed based on your signs and symptoms. How is this treated? In many cases, treatment is not needed for this condition. Making some changes to what you eat may help to control symptoms. Your health care provider may also prescribe or recommend:  Vitamin B6 supplements.  Anti-nausea medicines.  Ginger. Follow these instructions at home: Medicines  Take over-the-counter and prescription medicines only as told by your health care provider. Do not use any prescription, over-the-counter, or herbal medicines for morning sickness without first talking with your health care provider.  Take multivitamins before getting pregnant. This can prevent or decrease the severity of morning sickness in most women. Eating and drinking  Eat a piece of dry toast or crackers before getting out of bed in the morning.  Eat 5 or 6 small meals a day.  Eat dry  and bland foods, such as rice or a baked potato. Foods that are high in carbohydrates are often helpful.  Avoid greasy, fatty, and spicy foods.  Have someone cook for you if the smell of any food causes nausea and vomiting.  If you feel nauseous after taking prenatal vitamins, take the vitamins at night or with a snack.  Eat a protein snack between meals if you are hungry. Nuts, yogurt, and cheese are good options.  Drink fluids throughout the day.  Try ginger ale made with real ginger, ginger tea made from fresh grated ginger, or ginger candies. General instructions  Do not use any products that contain nicotine or tobacco. These products include cigarettes, chewing tobacco, and vaping devices, such as e-cigarettes. If you need help quitting, ask your health care provider.  Get an air purifier to keep the air in your house free of odors.  Get plenty of fresh air.  Try to avoid odors that trigger your nausea.  Consider trying these methods to help relieve symptoms: ? Wearing an acupressure wristband. These wristbands are often worn for seasickness. ? Acupuncture. Contact a health care provider if:  Your home remedies are not working and you need medicine.  You feel dizzy or light-headed.  You are losing weight. Get help right away if:  You have persistent and uncontrolled nausea and vomiting.  You faint.  You have severe pain in your abdomen. Summary  Morning sickness is when a woman feels nauseous during pregnancy. This nauseous feeling may or may not come with vomiting.  Morning sickness is most common during the first trimester.  It often occurs in the   morning, but it can be a problem at any time of day.  In many cases, treatment is not needed for this condition. Making some changes to what you eat may help to control symptoms. This information is not intended to replace advice given to you by your health care provider. Make sure you discuss any questions you have  with your health care provider. Document Revised: 01/13/2020 Document Reviewed: 12/23/2019 Elsevier Patient Education  2021 Elsevier Inc.  Obstetrics: Normal and Problem Pregnancies (7th ed., pp. 102-121). [place], [place]: Elsevier."> Textbook of Family Medicine (9th ed., pp. [phone]). [place], [place]: Elsevier [employer].">  First Trimester of Pregnancy  The first trimester of pregnancy starts on the first day of your last menstrual period until the end of week 12. This is months 1 through 3 of pregnancy. A week after a sperm fertilizes an egg, the egg will implant into the wall of the uterus and begin to develop into a baby. By the end of 12 weeks, all the baby's organs will be formed and the baby will be 2-3 inches in size. Body changes during your first trimester Your body goes through many changes during pregnancy. The changes vary and generally return to normal after your baby is born. Physical changes  You may gain or lose weight.  Your breasts may begin to grow larger and become tender. The tissue that surrounds your nipples (areola) may become darker.  Dark spots or blotches (chloasma or mask of pregnancy) may develop on your face.  You may have changes in your hair. These can include thickening or thinning of your hair or changes in texture. Health changes  You may feel nauseous, and you may vomit.  You may have heartburn.  You may develop headaches.  You may develop constipation.  Your gums may bleed and may be sensitive to brushing and flossing. Other changes  You may tire easily.  You may urinate more often.  Your menstrual periods will stop.  You may have a loss of appetite.  You may develop cravings for certain kinds of food.  You may have changes in your emotions from day to day.  You may have more vivid and strange dreams. Follow these instructions at home: Medicines  Follow your health care provider's instructions regarding medicine use. Specific  medicines may be either safe or unsafe to take during pregnancy. Do not take any medicines unless told to by your health care provider.  Take a prenatal vitamin that contains at least 600 micrograms (mcg) of folic acid. Eating and drinking  Eat a healthy diet that includes fresh fruits and vegetables, whole grains, good sources of protein such as meat, eggs, or tofu, and low-fat dairy products.  Avoid raw meat and unpasteurized juice, milk, and cheese. These carry germs that can harm you and your baby.  If you feel nauseous or you vomit: ? Eat 4 or 5 small meals a day instead of 3 large meals. ? Try eating a few soda crackers. ? Drink liquids between meals instead of during meals.  You may need to take these actions to prevent or treat constipation: ? Drink enough fluid to keep your urine pale yellow. ? Eat foods that are high in fiber, such as beans, whole grains, and fresh fruits and vegetables. ? Limit foods that are high in fat and processed sugars, such as fried or sweet foods. Activity  Exercise only as directed by your health care provider. Most people can continue their usual exercise routine during pregnancy. Try  to exercise for 30 minutes at least 5 days a week.  Stop exercising if you develop pain or cramping in the lower abdomen or lower back.  Avoid exercising if it is very hot or humid or if you are at high altitude.  Avoid heavy lifting.  If you choose to, you may have sex unless your health care provider tells you not to. Relieving pain and discomfort  Wear a good support bra to relieve breast tenderness.  Rest with your legs elevated if you have leg cramps or low back pain.  If you develop bulging veins (varicose veins) in your legs: ? Wear support hose as told by your health care provider. ? Elevate your feet for 15 minutes, 3-4 times a day. ? Limit salt in your diet. Safety  Wear your seat belt at all times when driving or riding in a car.  Talk with your  health care provider if someone is verbally or physically abusive to you.  Talk with your health care provider if you are feeling sad or have thoughts of hurting yourself. Lifestyle  Do not use hot tubs, steam rooms, or saunas.  Do not douche. Do not use tampons or scented sanitary pads.  Do not use herbal remedies, alcohol, illegal drugs, or medicines that are not approved by your health care provider. Chemicals in these products can harm your baby.  Do not use any products that contain nicotine or tobacco, such as cigarettes, e-cigarettes, and chewing tobacco. If you need help quitting, ask your health care provider.  Avoid cat litter boxes and soil used by cats. These carry germs that can cause birth defects in the baby and possibly loss of the unborn baby (fetus) by miscarriage or stillbirth. General instructions  During routine prenatal visits in the first trimester, your health care provider will do a physical exam, perform necessary tests, and ask you how things are going. Keep all follow-up visits. This is important.  Ask for help if you have counseling or nutritional needs during pregnancy. Your health care provider can offer advice or refer you to specialists for help with various needs.  Schedule a dentist appointment. At home, brush your teeth with a soft toothbrush. Floss gently.  Write down your questions. Take them to your prenatal visits. Where to find more information  American Pregnancy Association: americanpregnancy.org  [employer]: https://www.todd-brady.net/  Office on [employer] Health: MightyReward[employer].nz Contact a health care provider if you have:  Dizziness.  A fever.  Mild pelvic cramps, pelvic pressure, or nagging pain in the abdominal area.  Nausea, vomiting, or diarrhea that lasts for 24 hours or longer.  A bad-smelling vaginal discharge.  Pain when you urinate.  Known exposure to a  contagious illness, such as chickenpox, measles, Zika virus, HIV, or hepatitis. Get help right away if you have:  Spotting or bleeding from your vagina.  Severe abdominal cramping or pain.  Shortness of breath or chest pain.  Any kind of trauma, such as from a fall or a car crash.  New or increased pain, swelling, or redness in an arm or leg. Summary  The first trimester of pregnancy starts on the first day of your last menstrual period until the end of week 12 (months 1 through 3).  Eating 4 or 5 small meals a day rather than 3 large meals may help to relieve nausea and vomiting.  Do not use any products that contain nicotine or tobacco, such as cigarettes, e-cigarettes, and chewing tobacco.  If you need help quitting, ask your health care provider.  Keep all follow-up visits. This is important. This information is not intended to replace advice given to you by your health care provider. Make sure you discuss any questions you have with your health care provider. Document Revised: 11/06/2019 Document Reviewed: 09/12/2019 Elsevier Patient Education  2021 Elsevier Inc.   Second Trimester of Pregnancy  The second trimester of pregnancy is from week 13 through week 27. This is months 4 through 6 of pregnancy. The second trimester is often a time when you feel your best. Your body has adjusted to being pregnant, and you begin to feel better physically. During the second trimester:  Morning sickness has lessened or stopped completely.  You may have more energy.  You may have an increase in appetite. The second trimester is also a time when the unborn baby (fetus) is growing rapidly. At the end of the sixth month, the fetus may be up to 12 inches long and weigh about 1 pounds. You will likely begin to feel the baby move (quickening) between 16 and 20 weeks of pregnancy. Body changes during your second trimester Your body continues to go through many changes during your second  trimester. The changes vary and generally return to normal after the baby is born. Physical changes  Your weight will continue to increase. You will notice your lower abdomen bulging out.  You may begin to get stretch marks on your hips, abdomen, and breasts.  Your breasts will continue to grow and to become tender.  Dark spots or blotches (chloasma or mask of pregnancy) may develop on your face.  A dark line from your belly button to the pubic area (linea nigra) may appear.  You may have changes in your hair. These can include thickening of your hair, rapid growth, and changes in texture. Some people also have hair loss during or after pregnancy, or hair that feels dry or thin. Health changes  You may develop headaches.  You may have heartburn.  You may develop constipation.  You may develop hemorrhoids or swollen, bulging veins (varicose veins).  Your gums may bleed and may be sensitive to brushing and flossing.  You may urinate more often because the fetus is pressing on your bladder.  You may have back pain. This is caused by: ? Weight gain. ? Pregnancy hormones that are relaxing the joints in your pelvis. ? A shift in weight and the muscles that support your balance. Follow these instructions at home: Medicines  Follow your health care provider's instructions regarding medicine use. Specific medicines may be either safe or unsafe to take during pregnancy. Do not take any medicines unless approved by your health care provider.  Take a prenatal vitamin that contains at least 600 micrograms (mcg) of folic acid. Eating and drinking  Eat a healthy diet that includes fresh fruits and vegetables, whole grains, good sources of protein such as meat, eggs, or tofu, and low-fat dairy products.  Avoid raw meat and unpasteurized juice, milk, and cheese. These carry germs that can harm you and your baby.  You may need to take these actions to prevent or treat constipation: ? Drink  enough fluid to keep your urine pale yellow. ? Eat foods that are high in fiber, such as beans, whole grains, and fresh fruits and vegetables. ? Limit foods that are high in fat and processed sugars, such as fried or sweet foods. Activity  Exercise only as directed by your health care  provider. Most people can continue their usual exercise routine during pregnancy. Try to exercise for 30 minutes at least 5 days a week. Stop exercising if you develop contractions in your uterus.  Stop exercising if you develop pain or cramping in the lower abdomen or lower back.  Avoid exercising if it is very hot or humid or if you are at a high altitude.  Avoid heavy lifting.  If you choose to, you may have sex unless your health care provider tells you not to. Relieving pain and discomfort  Wear a supportive bra to prevent discomfort from breast tenderness.  Take warm sitz baths to soothe any pain or discomfort caused by hemorrhoids. Use hemorrhoid cream if your health care provider approves.  Rest with your legs raised (elevated) if you have leg cramps or low back pain.  If you develop varicose veins: ? Wear support hose as told by your health care provider. ? Elevate your feet for 15 minutes, 3-4 times a day. ? Limit salt in your diet. Safety  Wear your seat belt at all times when driving or riding in a car.  Talk with your health care provider if someone is verbally or physically abusive to you. Lifestyle  Do not use hot tubs, steam rooms, or saunas.  Do not douche. Do not use tampons or scented sanitary pads.  Avoid cat litter boxes and soil used by cats. These carry germs that can cause birth defects in the baby and possibly loss of the fetus by miscarriage or stillbirth.  Do not use herbal remedies, alcohol, illegal drugs, or medicines that are not approved by your health care provider. Chemicals in these products can harm your baby.  Do not use any products that contain nicotine or  tobacco, such as cigarettes, e-cigarettes, and chewing tobacco. If you need help quitting, ask your health care provider. General instructions  During a routine prenatal visit, your health care provider will do a physical exam and other tests. He or she will also discuss your overall health. Keep all follow-up visits. This is important.  Ask your health care provider for a referral to a local prenatal education class.  Ask for help if you have counseling or nutritional needs during pregnancy. Your health care provider can offer advice or refer you to specialists for help with various needs. Where to find more information  American Pregnancy Association: americanpregnancy.org  [employer]necologists: https://www.todd-brady.net/  Office on [employer] Health: MightyReward[employer]co.nz Contact a health care provider if you have:  A headache that does not go away when you take medicine.  Vision changes or you see spots in front of your eyes.  Mild pelvic cramps, pelvic pressure, or nagging pain in the abdominal area.  Persistent nausea, vomiting, or diarrhea.  A bad-smelling vaginal discharge or foul-smelling urine.  Pain when you urinate.  Sudden or extreme swelling of your face, hands, ankles, feet, or legs.  A fever. Get help right away if you:  Have fluid leaking from your vagina.  Have spotting or bleeding from your vagina.  Have severe abdominal cramping or pain.  Have difficulty breathing.  Have chest pain.  Have fainting spells.  Have not felt your baby move for the time period told by your health care provider.  Have new or increased pain, swelling, or redness in an arm or leg. Summary  The second trimester of pregnancy is from week 13 through week 27 (months 4 through 6).  Do not use herbal remedies, alcohol,  illegal drugs, or medicines that are not approved by your health care provider. Chemicals in these products can  harm your baby.  Exercise only as directed by your health care provider. Most people can continue their usual exercise routine during pregnancy.  Keep all follow-up visits. This is important. This information is not intended to replace advice given to you by your health care provider. Make sure you discuss any questions you have with your health care provider. Document Revised: 11/06/2019 Document Reviewed: 09/12/2019 Elsevier Patient Education  2021 [employer]

## 2020-07-29 NOTE — Progress Notes (Signed)
INITIAL OBSTETRICAL VISIT Patient name: Dorothy Olsen MRN [id number]  Date of birth: [birth year] Chief Complaint:   Initial Prenatal Visit  History of Present Illness:   Dorothy Olsen is a 30 y.o. G15P1001 African American female at [redacted]w[redacted]d by LMP with an Estimated Date of Delivery: 02/16/21 being seen today for her initial obstetrical visit.  Her obstetrical history is significant for h/o cholestasis in first pregnancy. This is a planned pregnancy. She and the father of the baby (FOB) "Shae" live together. She has a support system that consists of her spouse/family/friends. Today she reports nausea and vomiting. She and spouse reports that she is not drinking that much water.   Patient's last menstrual period was 05/12/2020 (approximate). Last pap 08/16/2018. Results were: normal Review of Systems:   Pertinent items are noted in HPI Denies cramping/contractions, leakage of fluid, vaginal bleeding, abnormal vaginal discharge w/ itching/odor/irritation, headaches, visual changes, shortness of breath, chest pain, abdominal pain, severe nausea/vomiting, or problems with urination or bowel movements unless otherwise stated above.  Pertinent History Reviewed:  Reviewed past medical,surgical, social, obstetrical and family history.  Reviewed problem list, medications and allergies. OB History  Gravida Para Term Preterm AB Living  2 1 1     1   SAB IAB Ectopic Multiple Live Births          1    # Outcome Date GA Lbr Len/2nd Weight Sex Delivery Anes PTL Lv  2 Current           1 Term 01/18/19 [redacted]w[redacted]d  5 lb 8 oz (2.495 kg) M Vag-Spont   LIV     Complications: Cholecystitis   Physical Assessment:   Vitals:   07/29/20 1523  BP: 122/77  Pulse: 85  Temp: 98 F (36.7 C)  Weight: 185 lb (83.9 kg)  Body mass index is 29.86 kg/m.       Physical Examination:  General appearance - well appearing, and in no distress  Mental status - alert, oriented to person, place, and time  Psych:  She has a  normal mood and affect  Skin - warm and dry, normal color, no suspicious lesions noted  Chest - effort normal, all lung fields clear to auscultation bilaterally  Heart - normal rate and regular rhythm  Abdomen - soft, nontender  Extremities:  No swelling or varicosities noted  Pelvic - VULVA: normal appearing vulva with no masses, tenderness or lesions  VAGINA: normal appearing vagina with normal color and discharge, no lesions.    CERVIX: normal appearing cervix without discharge or lesions, no CMT  Thin prep pap is not done   Patient informed that the ultrasound is considered a limited OB ultrasound and is not intended to be a complete ultrasound exam.  Patient also informed that the ultrasound is not being completed with the intent of assessing for fetal or placental anomalies or any pelvic abnormalities.  Explained that the purpose of today's ultrasound is to assess for viability.  FHTs by U/S: 145 bpm. Patient acknowledges the purpose of the exam and the limitations of the study.  Assessment & Plan:  1) Low-Risk Pregnancy G2P1001 at [redacted]w[redacted]d with an Estimated Date of Delivery: 02/16/21   2) Initial OB visit - Welcomed to practice and introduced self to patient in addition to discussing other advanced practice providers that she may be seeing at this practice - Congratulated patient - Anticipatory guidance on upcoming appointments - Educated on COVID19 and pregnancy and the integration of virtual appointments  - Educated on  babyscripts app- patient reports she has not received email, encouraged to look in spam folder and to call office if she still has not received email - patient verbalizes understanding    3) History of cholestasis during pregnancy - Bile acids, total - Comprehensive metabolic panel  4) Supervision of other normal pregnancy, antepartum - CBC/D/Plt+RPR+Rh+ABO+Rub Ab... - Culture, OB Urine - Cervicovaginal ancillary only( [place]) - Cytology - PAP( [place]) -  Genetic Screening - Hemoglobin A1c   5)  Nausea and vomiting of pregnancy, antepartum - Rx for metoCLOPramide (REGLAN) 10 MG tablet; Take 1 tablet (10 mg total) by mouth every 6 (six) hours as needed for nausea.  Dispense: 120 tablet; Refill: 0 - Rx for ondansetron (ZOFRAN ODT) 4 MG disintegrating tablet; Take 1 tablet (4 mg total) by mouth every 8 (eight) hours as needed for nausea or vomiting.  Dispense: 30 tablet; Refill: 1 - Advised to only Zofran if Reglan is not working - Encouraged to increase daily water intake to help with N/V as well    Meds:  Meds ordered this encounter  Medications  . metoCLOPramide (REGLAN) 10 MG tablet    Sig: Take 1 tablet (10 mg total) by mouth every 6 (six) hours as needed for nausea.    Dispense:  120 tablet    Refill:  0  . ondansetron (ZOFRAN ODT) 4 MG disintegrating tablet    Sig: Take 1 tablet (4 mg total) by mouth every 8 (eight) hours as needed for nausea or vomiting.    Dispense:  30 tablet    Refill:  1    Initial labs reviewed Continue prenatal vitamins Reviewed n/v relief measures and warning s/s to report Reviewed recommended weight gain based on pre-gravid BMI Encouraged well-balanced diet Genetic Screening discussed: ordered Cystic fibrosis, SMA, Fragile X screening discussed ordered The nature of [facility] with multiple MDs and other Advanced Practice Providers was explained to patient; also emphasized that residents, students are part of our team.  Discussed optimized OB schedule and video visits. Advised can have an in-office visit whenever she feels she needs to be seen.  Does own BP cuff. Explained to patient that BP will bemailed to her house. Check BP weekly, let us know if >140/90. Advised to call during normal business hours and there is an after-hours nurse line available.    Follow-up: Return in about 6 weeks (around 09/09/2020) for Return OB visit - [facility].   Orders Placed This  Encounter  Procedures  . Culture, OB Urine  . [place] MFM OB COMP + 14 WK  . CBC/D/Plt+RPR+Rh+ABO+Rub Ab...  . Genetic Screening  . Hemoglobin A1c  . Bile acids, total  . Comprehensive metabolic panel    Raelyn Mora MSN, CNM 07/29/2020

## 2020-07-31 ENCOUNTER — Other Ambulatory Visit (INDEPENDENT_AMBULATORY_CARE_PROVIDER_SITE_OTHER): Payer: Medicaid Other | Admitting: Obstetrics and Gynecology

## 2020-07-31 ENCOUNTER — Telehealth: Payer: Self-pay

## 2020-07-31 ENCOUNTER — Other Ambulatory Visit: Payer: Self-pay | Admitting: Obstetrics and Gynecology

## 2020-07-31 DIAGNOSIS — O26619 Liver and biliary tract disorders in pregnancy, unspecified trimester: Secondary | ICD-10-CM

## 2020-07-31 DIAGNOSIS — K831 Obstruction of bile duct: Secondary | ICD-10-CM | POA: Insufficient documentation

## 2020-07-31 LAB — CERVICOVAGINAL ANCILLARY ONLY
Bacterial Vaginitis (gardnerella): NEGATIVE
Candida Glabrata: NEGATIVE
Candida Vaginitis: POSITIVE — AB
Chlamydia: NEGATIVE
Comment: NEGATIVE
Comment: NEGATIVE
Comment: NEGATIVE
Comment: NEGATIVE
Comment: NEGATIVE
Comment: NORMAL
Neisseria Gonorrhea: NEGATIVE
Trichomonas: NEGATIVE

## 2020-07-31 LAB — BILE ACIDS, TOTAL: Bile Acids Total: 20 umol/L (ref 0.0–10.0)

## 2020-07-31 LAB — HCV INTERPRETATION

## 2020-07-31 LAB — CBC/D/PLT+RPR+RH+ABO+RUB AB...
Antibody Screen: NEGATIVE
Basophils Absolute: 0 10*3/uL (ref 0.0–0.2)
Basos: 1 %
EOS (ABSOLUTE): 0.2 10*3/uL (ref 0.0–0.4)
Eos: 3 %
HCV Ab: <0.1 s/co ratio (ref 0.0–0.9)
HIV Screen 4th Generation wRfx: NONREACTIVE
Hematocrit: 37.5 % (ref 34.0–46.6)
Hemoglobin: 11.8 g/dL (ref 11.1–15.9)
Hepatitis B Surface Ag: NEGATIVE
Immature Grans (Abs): 0 10*3/uL (ref 0.0–0.1)
Immature Granulocytes: 0 %
Lymphocytes Absolute: 2.3 10*3/uL (ref 0.7–3.1)
Lymphs: 38 %
MCH: 26.8 pg (ref 26.6–33.0)
MCHC: 31.5 g/dL (ref 31.5–35.7)
MCV: 85 fL (ref 79–97)
Monocytes Absolute: 0.4 10*3/uL (ref 0.1–0.9)
Monocytes: 7 %
Neutrophils Absolute: 3.1 10*3/uL (ref 1.4–7.0)
Neutrophils: 51 %
Platelets: 223 10*3/uL (ref 150–450)
RBC: 4.41 x10E6/uL (ref 3.77–5.28)
RDW: 13.4 % (ref 11.7–15.4)
RPR Ser Ql: NONREACTIVE
Rh Factor: POSITIVE
Rubella Antibodies, IGG: 2.79 index (ref >0.99)
WBC: 6.1 10*3/uL (ref 3.4–10.8)

## 2020-07-31 LAB — COMPREHENSIVE METABOLIC PANEL
ALT: 21 IU/L (ref 0–32)
AST: 19 IU/L (ref 0–40)
Albumin/Globulin Ratio: 1.4 (ref 1.2–2.2)
Albumin: 4.2 g/dL (ref 3.9–5.0)
Alkaline Phosphatase: 53 IU/L (ref 44–121)
BUN/Creatinine Ratio: 14 (ref 9–23)
BUN: 8 mg/dL (ref 6–20)
Bilirubin Total: <0.2 mg/dL (ref 0.0–1.2)
CO2: 18 mmol/L — ABNORMAL LOW (ref 20–29)
Calcium: 9.5 mg/dL (ref 8.7–10.2)
Chloride: 104 mmol/L (ref 96–106)
Creatinine, Ser: 0.59 mg/dL (ref 0.57–1.00)
GFR calc Af Amer: 142 mL/min/{1.73_m2} (ref >59)
GFR calc non Af Amer: 123 mL/min/{1.73_m2} (ref >59)
Globulin, Total: 3.1 g/dL (ref 1.5–4.5)
Glucose: 86 mg/dL (ref 65–99)
Potassium: 3.9 mmol/L (ref 3.5–5.2)
Sodium: 138 mmol/L (ref 134–144)
Total Protein: 7.3 g/dL (ref 6.0–8.5)

## 2020-07-31 LAB — HEMOGLOBIN A1C
Est. average glucose Bld gHb Est-mCnc: 94 mg/dL
Hgb A1c MFr Bld: 4.9 % (ref 4.8–5.6)

## 2020-07-31 MED ORDER — URSODIOL 500 MG PO TABS: 500.0000 mg | ORAL_TABLET | Freq: Two times a day (BID) | ORAL | 9 refills | Status: DC

## 2020-07-31 NOTE — Progress Notes (Signed)
*  Consult with Dr. Donavan Foil @ [phone] - notified of patient's assessments, and lab results, tx plan Rx for Actigall sent to pharmacy -> recommends to consult with MFM concerning Actigall timing and f/u plan.  *Consult with Dr. Judeth Cornfield @ 1032 - notified of patient's assessments, lab results, and Rx for Actigall sent - Dr. Judeth Cornfield agrees with plan >>recommended tx plan: advise to take Actigall (daily or BID dosing is acceptable) now or no later than 24 wks as start, redraw BA in 4 wks, no need to follow BA if she is asymptomatic, must follow BA when symptomatic, U/S at 20-24-26, weekly NST starting at 26-18 wks, weekly BPP starting at 36 wks, recommends delivery at 36-37 wks.  Patient notified via TC of dx, Rx and Dr. Zannie Kehr recommendations.  Raelyn Mora, CNM

## 2020-07-31 NOTE — Telephone Encounter (Signed)
Patient informed of Bile acid results. Patient verified DOB. Pt is aware of Dx: cholestasis. MFM detailed ultrasound >14 weeks scheduled for 09/24/20 at [facility]. Patient will be [redacted] wk gestation.   Clovis Pu, RN

## 2020-07-31 NOTE — Telephone Encounter (Signed)
Lab corp resulted patient bile acids 20.0. Please advise.

## 2020-08-01 ENCOUNTER — Other Ambulatory Visit: Payer: Self-pay | Admitting: Obstetrics and Gynecology

## 2020-08-01 DIAGNOSIS — B373 Candidiasis of vulva and vagina: Secondary | ICD-10-CM

## 2020-08-01 DIAGNOSIS — B3731 Acute candidiasis of vulva and vagina: Secondary | ICD-10-CM

## 2020-08-01 LAB — CULTURE, OB URINE

## 2020-08-01 LAB — URINE CULTURE, OB REFLEX

## 2020-08-01 MED ORDER — TERCONAZOLE 0.4 % VA CREA: 1.0000 | TOPICAL_CREAM | Freq: Every day | VAGINAL | 0 refills | Status: DC

## 2020-08-01 NOTE — Progress Notes (Signed)
Notified via My Chart

## 2020-08-03 ENCOUNTER — Encounter: Payer: Self-pay | Admitting: *Deleted

## 2020-08-04 ENCOUNTER — Encounter: Payer: Self-pay | Admitting: *Deleted

## 2020-08-10 ENCOUNTER — Encounter: Payer: Self-pay | Admitting: *Deleted

## 2020-08-19 IMAGING — US US MFM FETAL BPP WO NON STRESS
1 series · 15 of 28 positions shown · non-contrast
Comparison: none

[Series 1: us mfm fetal bpp wo non stress · 31 acquisitions, 15 frames shown]
[im 1/31]
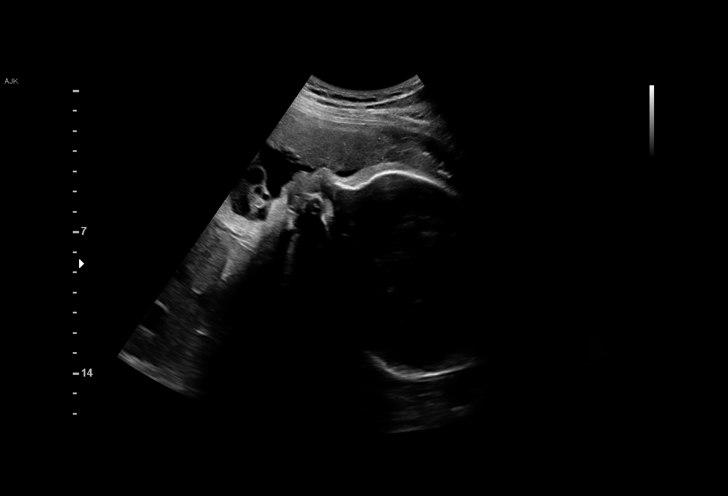
[im 3/31]
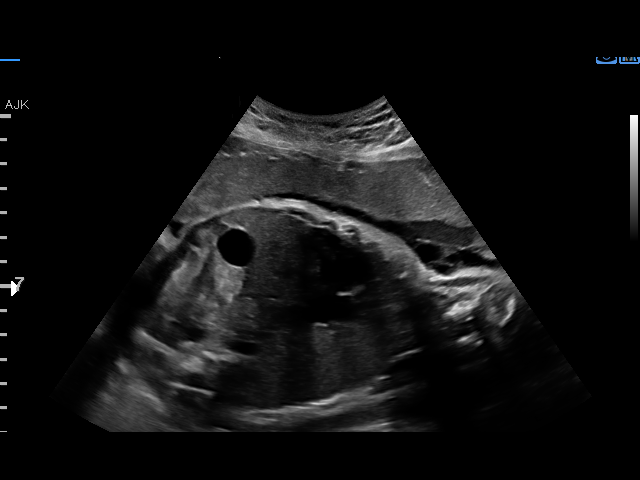
[im 5/31]
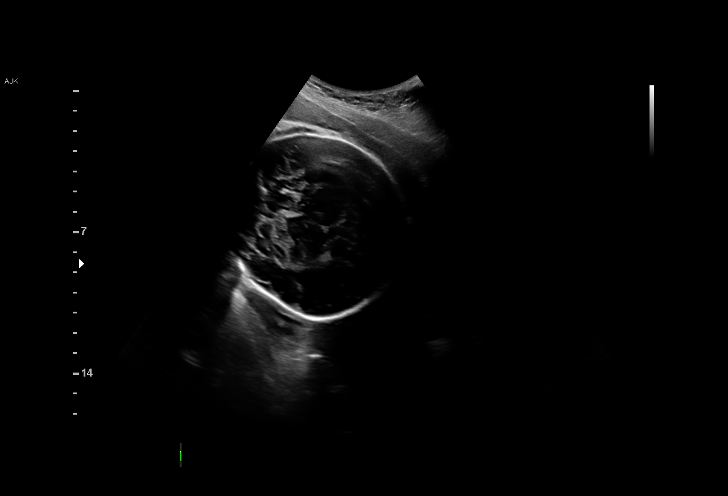
[im 7/31]
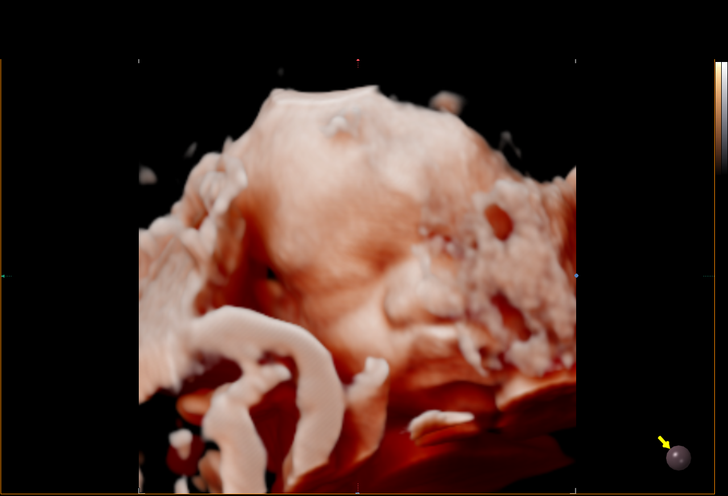
[im 9/31]
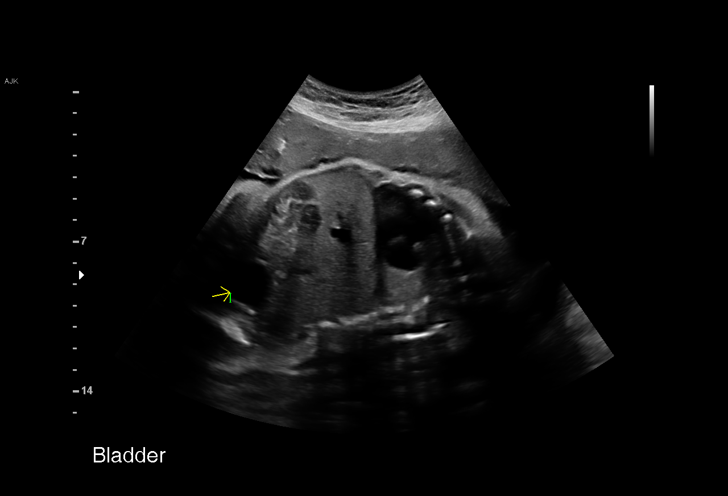
[im 12/31]
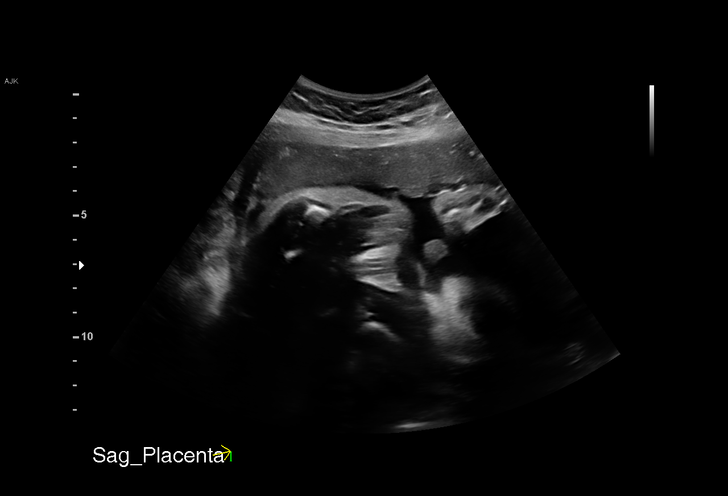
[im 14/31]
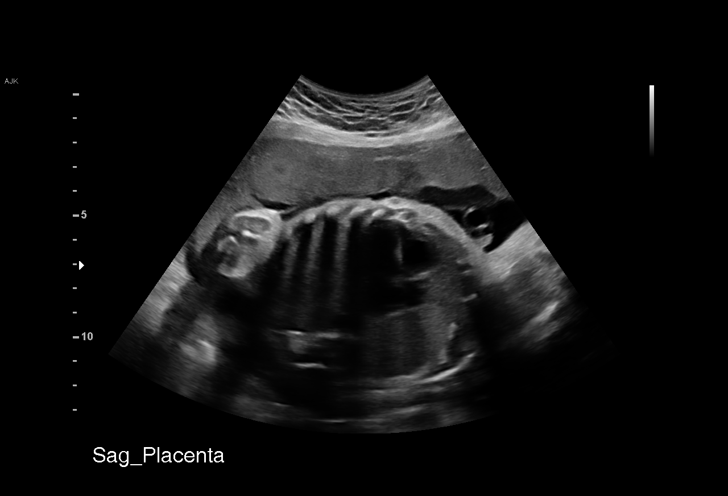
[im 16/31]
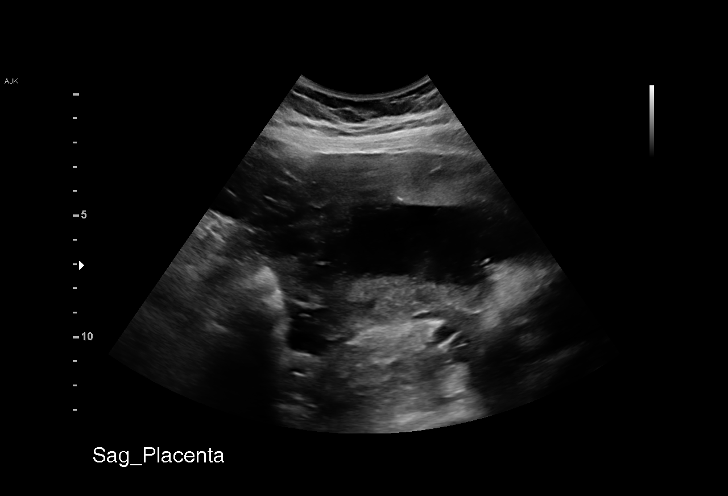
[im 17/31]
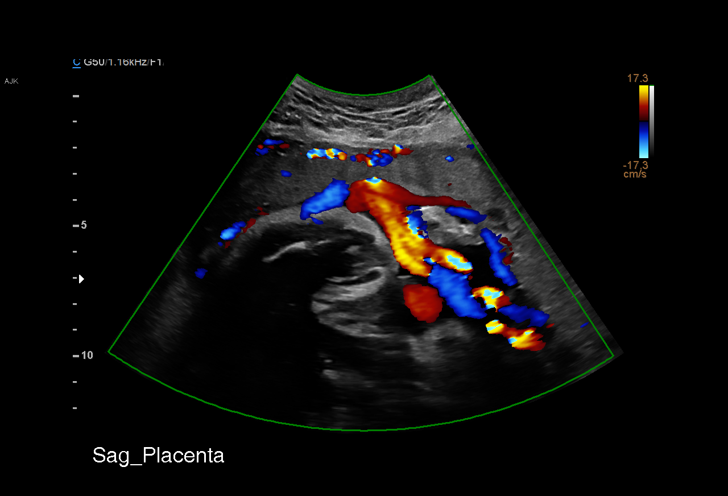
[im 19/31]
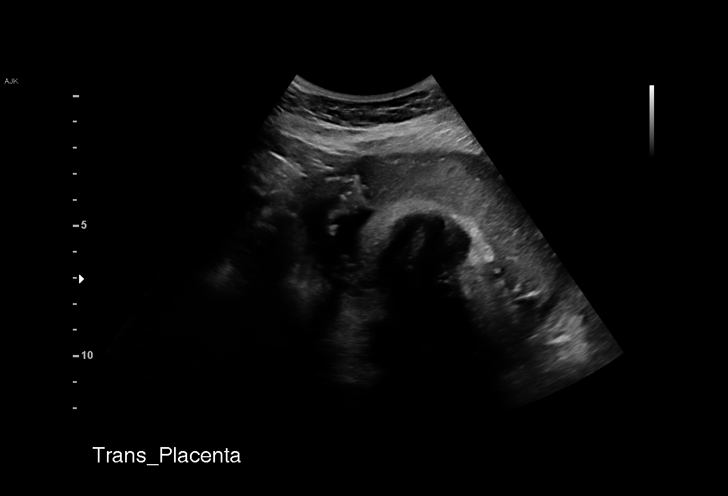
[im 22/31]
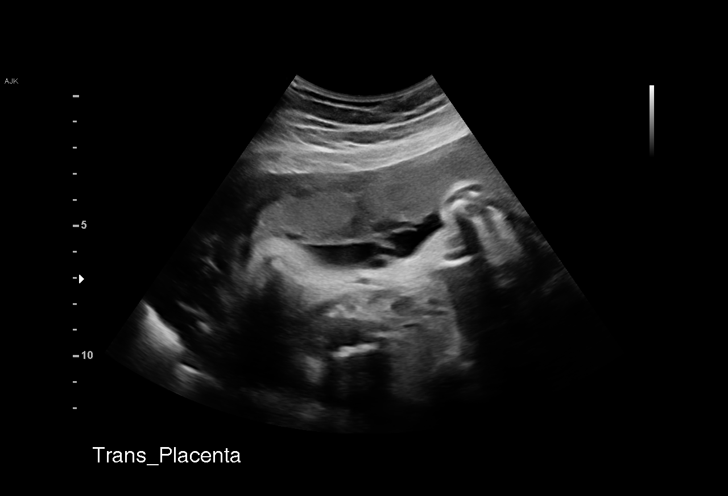
[im 24/31]
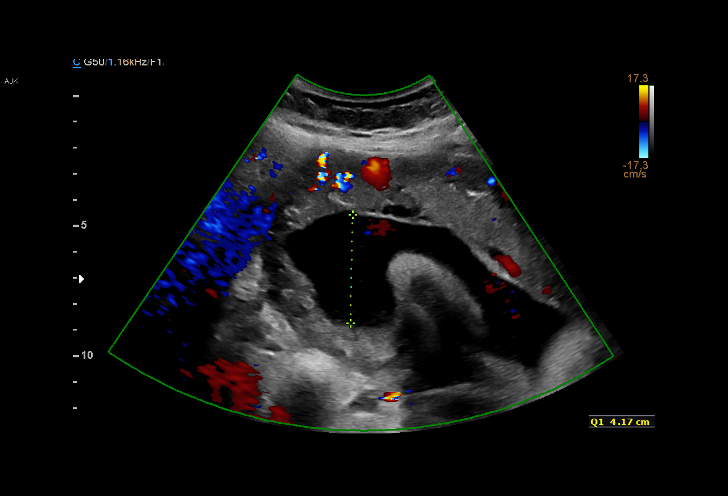
[im 26/31]
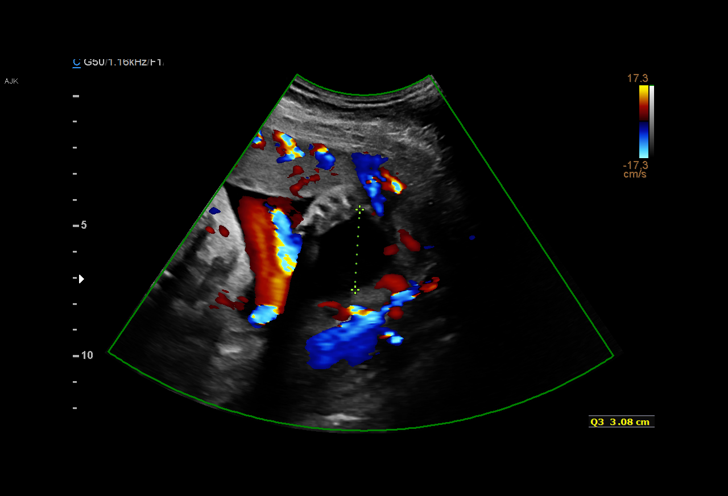
[im 28/31]
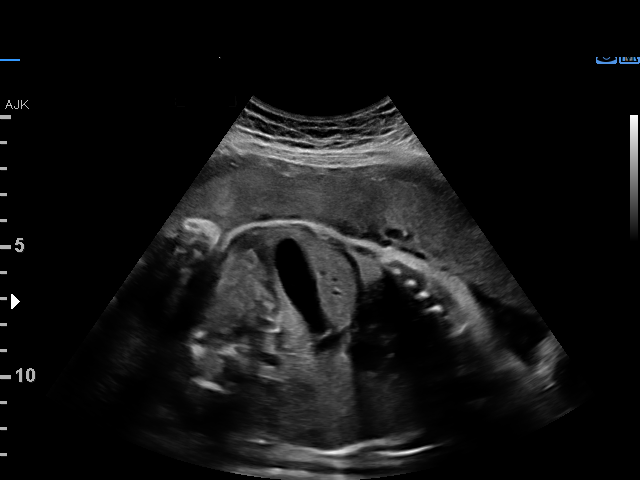
[im 31/31]
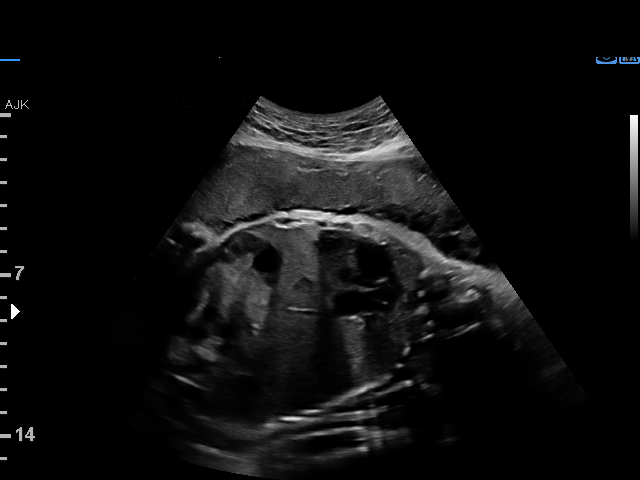

[15 of 28 positions shown; findings below may reference images not displayed]

MOUCTAR CNM                                [HOSPITAL]

     STRESS                                            FRAZIER
 ----------------------------------------------------------------------

 ----------------------------------------------------------------------
Indications

  Cholestasis of pregnancy, third trimester      [id number]
  (Actigall)
  Thrombocytopenia affecting pregnancy,          O99.119,
  antepartum
  Genetic carrier (Silent carrier for Alpha-
  Thalassemia Horizon Screen)( Low Risk
  NIPS)
  33 weeks gestation of pregnancy
 ----------------------------------------------------------------------
Vital Signs

                                                Height:        5'6"
Fetal Evaluation

 Num Of Fetuses:          1
 Fetal Heart Rate(bpm):   130
 Cardiac Activity:        Observed
 Presentation:            Cephalic
 Placenta:                Anterior
 P. Cord Insertion:       Visualized, central

 Amniotic Fluid
 AFI FV:      Within normal limits

 AFI Sum(cm)     %Tile       Largest Pocket(cm)
 10.46           22

 RUQ(cm)                     LUQ(cm)        LLQ(cm)

Biophysical Evaluation

 Amniotic F.V:   Within normal limits       F. Tone:         Observed
 F. Movement:    Observed                   Score:           [DATE]
 F. Breathing:   Observed
OB History

 Gravidity:    1
Gestational Age

 LMP:           33w 6d        Date:  05/03/18                 EDD:   02/07/19
 Best:          33w 6d     Det. By:  LMP  (05/03/18)          EDD:   02/07/19
Impression

 Amniotic fluid is normal and good fetal activity is seen.
 Antenatal testing is reassuring. BPP [DATE].
Recommendations

 -Continue weekly BPP till delivery.
                 [place], [place]

## 2020-09-09 ENCOUNTER — Ambulatory Visit (INDEPENDENT_AMBULATORY_CARE_PROVIDER_SITE_OTHER): Payer: Medicaid Other | Admitting: Certified Nurse Midwife

## 2020-09-09 ENCOUNTER — Other Ambulatory Visit: Payer: Self-pay

## 2020-09-09 VITALS — BP 118/78 | HR 89 | Temp 97.8°F | Wt 187.4 lb

## 2020-09-09 DIAGNOSIS — O26619 Liver and biliary tract disorders in pregnancy, unspecified trimester: Secondary | ICD-10-CM

## 2020-09-09 DIAGNOSIS — K831 Obstruction of bile duct: Secondary | ICD-10-CM

## 2020-09-09 DIAGNOSIS — Z3480 Encounter for supervision of other normal pregnancy, unspecified trimester: Secondary | ICD-10-CM

## 2020-09-09 DIAGNOSIS — Z3A17 17 weeks gestation of pregnancy: Secondary | ICD-10-CM

## 2020-09-09 DIAGNOSIS — O219 Vomiting of pregnancy, unspecified: Secondary | ICD-10-CM

## 2020-09-09 MED ORDER — ONDANSETRON 4 MG PO TBDP: 4.0000 mg | ORAL_TABLET | Freq: Three times a day (TID) | ORAL | 1 refills | Status: DC | PRN

## 2020-09-09 NOTE — Progress Notes (Signed)
   PRENATAL VISIT NOTE  Subjective:  Dorothy Olsen is a 30 y.o. G2P1001 at [redacted]w[redacted]d being seen today for ongoing prenatal care.  She is currently monitored for the following issues for this low-risk pregnancy and has Cholestasis of pregnancy, antepartum and Supervision of other normal pregnancy, antepartum on their problem list.  Patient reports nausea, has stopped most vomiting but continues to have nausea. Denies itching. Contractions: Not present. Vag. Bleeding: None.  Movement: Absent. Denies leaking of fluid.   The following portions of the patient's history were reviewed and updated as appropriate: allergies, current medications, past family history, past medical history, past social history, past surgical history and problem list.   Objective:   Vitals:   09/09/20 1431  BP: 118/78  Pulse: 89  Temp: 97.8 F (36.6 C)  Weight: 187 lb 6.4 oz (85 kg)    Fetal Status: Fetal Heart Rate (bpm): 143 Fundal Height: 17 cm Movement: Absent     General:  Alert, oriented and cooperative. Patient is in no acute distress.  Skin: Skin is warm and dry. No rash noted.   Cardiovascular: Normal heart rate noted  Respiratory: Normal respiratory effort, no problems with respiration noted  Abdomen: Soft, gravid, appropriate for gestational age.  Pain/Pressure: Present     Pelvic: Cervical exam deferred        Extremities: Normal range of motion.  Edema: None  Mental Status: Normal mood and affect. Normal behavior. Normal judgment and thought content.   Assessment and Plan:  Pregnancy: G2P1001 at [redacted]w[redacted]d 1. Supervision of other normal pregnancy, antepartum - Doing well overall, beginning to feel some fetal movement  2. [redacted] weeks gestation of pregnancy - Routine OB care - Anticipatory guidance on next visits - AFP, Serum, Open Spina Bifida  3. Cholestasis of pregnancy, antepartum - Taking 500mg  ursodiol daily - AFP, Serum, Open Spina Bifida  4. Nausea and vomiting of pregnancy, antepartum -  ondansetron (ZOFRAN ODT) 4 MG disintegrating tablet; Take 1 tablet (4 mg total) by mouth every 8 (eight) hours as needed for nausea or vomiting.  Dispense: 30 tablet; Refill: 1  Preterm labor symptoms and general obstetric precautions including but not limited to vaginal bleeding, contractions, leaking of fluid and fetal movement were reviewed in detail with the patient. Please refer to After Visit Summary for other counseling recommendations.   Return in about 4 weeks (around 10/07/2020) for IN-PERSON, LOB.  Future Appointments  Date Time Provider Department Center  09/29/2020  2:00 PM [facility] NURSE [facility]  09/29/2020  2:15 PM [facility]  10/07/2020  2:10 PM 10/09/2020, CNM [facility] None    Raelyn Mora, [place]ner[place]

## 2020-09-11 LAB — AFP, SERUM, OPEN SPINA BIFIDA
AFP MoM: 1.01
AFP Value: 38.7 ng/mL
Gest. Age on Collection Date: 17 weeks
Maternal Age At EDD: 30.9 yr
OSBR Risk 1 IN: 10000
Test Results:: NEGATIVE
Weight: 187 [lb_av]

## 2020-09-29 ENCOUNTER — Other Ambulatory Visit: Payer: Self-pay

## 2020-09-29 ENCOUNTER — Encounter: Payer: Self-pay | Admitting: *Deleted

## 2020-09-29 ENCOUNTER — Other Ambulatory Visit: Payer: Self-pay | Admitting: *Deleted

## 2020-09-29 ENCOUNTER — Ambulatory Visit (HOSPITAL_BASED_OUTPATIENT_CLINIC_OR_DEPARTMENT_OTHER): Payer: Medicaid Other

## 2020-09-29 ENCOUNTER — Ambulatory Visit: Payer: Medicaid Other | Attending: Obstetrics and Gynecology | Admitting: *Deleted

## 2020-09-29 DIAGNOSIS — Z148 Genetic carrier of other disease: Secondary | ICD-10-CM | POA: Diagnosis not present

## 2020-09-29 DIAGNOSIS — K831 Obstruction of bile duct: Secondary | ICD-10-CM

## 2020-09-29 DIAGNOSIS — Z363 Encounter for antenatal screening for malformations: Secondary | ICD-10-CM | POA: Diagnosis not present

## 2020-09-29 DIAGNOSIS — Z3480 Encounter for supervision of other normal pregnancy, unspecified trimester: Secondary | ICD-10-CM

## 2020-09-29 DIAGNOSIS — Z3A20 20 weeks gestation of pregnancy: Secondary | ICD-10-CM

## 2020-09-29 DIAGNOSIS — O321XX0 Maternal care for breech presentation, not applicable or unspecified: Secondary | ICD-10-CM | POA: Diagnosis not present

## 2020-09-29 DIAGNOSIS — O26619 Liver and biliary tract disorders in pregnancy, unspecified trimester: Secondary | ICD-10-CM

## 2020-09-29 DIAGNOSIS — O26612 Liver and biliary tract disorders in pregnancy, second trimester: Secondary | ICD-10-CM | POA: Diagnosis present

## 2020-10-07 ENCOUNTER — Other Ambulatory Visit (HOSPITAL_COMMUNITY)
Admission: RE | Admit: 2020-10-07 | Discharge: 2020-10-07 | Disposition: A | Discharge status: Home / Self Care | Payer: Medicaid Other | Source: Ambulatory Visit | Attending: Obstetrics and Gynecology | Admitting: Obstetrics and Gynecology

## 2020-10-07 ENCOUNTER — Other Ambulatory Visit: Payer: Self-pay

## 2020-10-07 ENCOUNTER — Ambulatory Visit (INDEPENDENT_AMBULATORY_CARE_PROVIDER_SITE_OTHER): Payer: Medicaid Other | Admitting: Obstetrics and Gynecology

## 2020-10-07 VITALS — BP 111/74 | HR 89 | Temp 98.0°F | Wt 187.0 lb

## 2020-10-07 DIAGNOSIS — N898 Other specified noninflammatory disorders of vagina: Secondary | ICD-10-CM | POA: Diagnosis present

## 2020-10-07 DIAGNOSIS — Z3480 Encounter for supervision of other normal pregnancy, unspecified trimester: Secondary | ICD-10-CM | POA: Insufficient documentation

## 2020-10-07 DIAGNOSIS — Z3A21 21 weeks gestation of pregnancy: Secondary | ICD-10-CM | POA: Insufficient documentation

## 2020-10-07 DIAGNOSIS — O26899 Other specified pregnancy related conditions, unspecified trimester: Secondary | ICD-10-CM | POA: Diagnosis present

## 2020-10-08 LAB — CERVICOVAGINAL ANCILLARY ONLY
Bacterial Vaginitis (gardnerella): NEGATIVE
Candida Glabrata: NEGATIVE
Candida Vaginitis: POSITIVE — AB
Chlamydia: NEGATIVE
Comment: NEGATIVE
Comment: NEGATIVE
Comment: NEGATIVE
Comment: NEGATIVE
Comment: NEGATIVE
Comment: NORMAL
Neisseria Gonorrhea: NEGATIVE
Trichomonas: NEGATIVE

## 2020-10-12 NOTE — Progress Notes (Signed)
  HIGH-RISK PREGNANCY OFFICE VISIT Patient name: Dorothy Olsen MRN [id number]  Date of birth: 09/23/1990 Chief Complaint:   Routine Prenatal Visit  History of Present Illness:   Dorothy Olsen is a 30 y.o. G76P1001 female at [redacted]w[redacted]d with an Estimated Date of Delivery: 02/16/21 being seen today for ongoing management of a high-risk pregnancy complicated by intrahepatic cholestasis of pregnancy Today she reports no complaints. "Feeling better." Contractions: Not present. Vag. Bleeding: None.  Movement: Present. denies leaking of fluid.  Review of Systems:   Pertinent items are noted in HPI Denies abnormal vaginal discharge w/ itching/odor/irritation, headaches, visual changes, shortness of breath, chest pain, abdominal pain, severe nausea/vomiting, or problems with urination or bowel movements unless otherwise stated above. Pertinent History Reviewed:  Reviewed past medical,surgical, social, obstetrical and family history.  Reviewed problem list, medications and allergies. Physical Assessment:   Vitals:   10/07/20 1431  BP: 111/74  Pulse: 89  Temp: 98 F (36.7 C)  Weight: 187 lb (84.8 kg)  Body mass index is 30.18 kg/m.           Physical Examination:   General appearance: alert, well appearing, and in no distress, oriented to person, place, and time and normal appearing weight  Mental status: alert, oriented to person, place, and time, normal mood, behavior, speech, dress, motor activity, and thought processes  Skin: warm & dry   Extremities: Edema: None    Cardiovascular: normal heart rate noted  Respiratory: normal respiratory effort, no distress  Abdomen: gravid, soft, non-tender  Pelvic: Cervical exam deferred         Fetal Status: Fetal Heart Rate (bpm): 147   Movement: Present    Fetal Surveillance Testing today: none   No results found for this or any previous visit (from the past 24 hour(s)).  Assessment & Plan:  1) High-risk pregnancy G2P1001 at [redacted]w[redacted]d with an Estimated  Date of Delivery: 02/16/21   2) Supervision of other normal pregnancy, antepartum - Plan: Cervicovaginal ancillary only( [place])  3) Vaginal itching  - Cervicovaginal ancillary only( [place])  4) Vaginal discharge during pregnancy, antepartum  - Cervicovaginal ancillary only( [place])  5) [redacted] weeks gestation of pregnancy  - Cervicovaginal ancillary only( [place])   Meds: No orders of the defined types were placed in this encounter.   Labs/procedures today: self-swab collection  Treatment Plan:  Continue ICP mgmt plan set by Dr. Judeth Cornfield  Reviewed: Preterm labor symptoms and general obstetric precautions including but not limited to vaginal bleeding, contractions, leaking of fluid and fetal movement were reviewed in detail with the patient.  All questions were answered. Has home bp cuff.  Check bp weekly, let us know if >140/90.   Follow-up: Return in about 3 weeks (around 10/28/2020) for Return OB - My Chart video.  No orders of the defined types were placed in this encounter.  Raelyn Mora MSN, CNM 10/07/2020

## 2020-10-14 ENCOUNTER — Telehealth: Payer: Self-pay | Admitting: *Deleted

## 2020-10-14 DIAGNOSIS — B3731 Acute candidiasis of vulva and vagina: Secondary | ICD-10-CM

## 2020-10-14 DIAGNOSIS — B373 Candidiasis of vulva and vagina: Secondary | ICD-10-CM

## 2020-10-14 MED ORDER — TERCONAZOLE 0.4 % VA CREA: 1.0000 | TOPICAL_CREAM | Freq: Every day | VAGINAL | 0 refills | Status: AC

## 2020-10-14 NOTE — Telephone Encounter (Signed)
-----   Message from Raelyn Mora, [place] sent at 10/14/2020 11:54 AM EDT ----- Please treat for yeast

## 2020-10-28 ENCOUNTER — Encounter: Payer: Medicaid Other | Admitting: Advanced Practice Midwife

## 2020-10-28 ENCOUNTER — Ambulatory Visit: Payer: Medicaid Other | Admitting: *Deleted

## 2020-10-28 ENCOUNTER — Ambulatory Visit: Payer: Medicaid Other | Attending: Obstetrics

## 2020-10-28 ENCOUNTER — Encounter: Payer: Self-pay | Admitting: *Deleted

## 2020-10-28 ENCOUNTER — Other Ambulatory Visit: Payer: Self-pay

## 2020-10-28 DIAGNOSIS — O26612 Liver and biliary tract disorders in pregnancy, second trimester: Secondary | ICD-10-CM | POA: Diagnosis not present

## 2020-10-28 DIAGNOSIS — Z3480 Encounter for supervision of other normal pregnancy, unspecified trimester: Secondary | ICD-10-CM | POA: Diagnosis present

## 2020-10-28 DIAGNOSIS — Z148 Genetic carrier of other disease: Secondary | ICD-10-CM

## 2020-10-28 DIAGNOSIS — Z362 Encounter for other antenatal screening follow-up: Secondary | ICD-10-CM | POA: Diagnosis not present

## 2020-10-28 DIAGNOSIS — O26619 Liver and biliary tract disorders in pregnancy, unspecified trimester: Secondary | ICD-10-CM | POA: Diagnosis present

## 2020-10-28 DIAGNOSIS — O322XX1 Maternal care for transverse and oblique lie, fetus 1: Secondary | ICD-10-CM

## 2020-10-28 DIAGNOSIS — K831 Obstruction of bile duct: Secondary | ICD-10-CM | POA: Insufficient documentation

## 2020-10-28 DIAGNOSIS — Z3A24 24 weeks gestation of pregnancy: Secondary | ICD-10-CM

## 2020-10-29 ENCOUNTER — Other Ambulatory Visit: Payer: Self-pay | Admitting: Maternal & Fetal Medicine

## 2020-10-29 ENCOUNTER — Telehealth: Payer: Medicaid Other | Admitting: Obstetrics and Gynecology

## 2020-10-29 ENCOUNTER — Telehealth (INDEPENDENT_AMBULATORY_CARE_PROVIDER_SITE_OTHER): Payer: Medicaid Other | Admitting: Obstetrics and Gynecology

## 2020-10-29 ENCOUNTER — Encounter: Payer: Self-pay | Admitting: Obstetrics and Gynecology

## 2020-10-29 DIAGNOSIS — Z3A24 24 weeks gestation of pregnancy: Secondary | ICD-10-CM

## 2020-10-29 DIAGNOSIS — K831 Obstruction of bile duct: Secondary | ICD-10-CM

## 2020-10-29 DIAGNOSIS — O26612 Liver and biliary tract disorders in pregnancy, second trimester: Secondary | ICD-10-CM

## 2020-10-29 DIAGNOSIS — O0992 Supervision of high risk pregnancy, unspecified, second trimester: Secondary | ICD-10-CM

## 2020-10-29 DIAGNOSIS — Z148 Genetic carrier of other disease: Secondary | ICD-10-CM

## 2020-10-29 NOTE — Progress Notes (Signed)
TELEPHONE VIRTUAL OBSTETRICS VISIT ENCOUNTER NOTE  I connected with Dorothy Olsen on 10/29/20 at  1:10 PM EDT by telephone (due to MyChart connectivity problems) at work and verified that I am speaking with the correct person using two identifiers. Provider located at [employer] for [employer] at [place].   I discussed the limitations, risks, security and privacy concerns of performing an evaluation and management service by My Chart video and the availability of in person appointments. I also discussed with the patient that there may be a patient responsible charge related to this service. The patient expressed understanding and agreed to proceed.  Subjective:  Dorothy Olsen is a 30 y.o. G2P1001 at [redacted]w[redacted]d being followed for ongoing prenatal care.  She is currently monitored for the following issues for this high-risk pregnancy and has Cholestasis of pregnancy, antepartum and Supervision of other normal pregnancy, antepartum on their problem list.  Patient reports no complaints. Reports fetal movement. Denies any contractions, bleeding or leaking of fluid.   The following portions of the patient's history were reviewed and updated as appropriate: allergies, current medications, past family history, past medical history, past social history, past surgical history and problem list.   Objective:   General:  Alert, oriented and cooperative.   Mental Status: Normal mood and affect perceived. Normal judgment and thought content.  Rest of physical exam deferred due to type of encounter  LMP 05/12/2020 (Approximate)  **VS will be done and sent via MyChart later (patient did not take BP cuff to work with her today)  Assessment and Plan:  Pregnancy: G2P1001 at [redacted]w[redacted]d  1. Supervision of high risk pregnancy in second trimester - Normal US done yesterday - Anticipatory guidance for 2 hr GTT - advised to fast after midnight without anything to eat or drink (except for water), will have  fasting blood drawn, drink the glucola drink (flavor choices: orange, fruit punch or lemon-lime), have a visit with a provider during the first hour of testing, wait in the lab waiting room to have blood drawn at 1 hour and then 2 hours after finishing glucola drink.  2. Cholestasis of pregnancy, antepartum - Taking Actigall daily - Continue with Dr. Zannie Kehr plan>>weekly antenatal testing starting at 32 weeks.  3. [redacted] weeks gestation of pregnancy   Preterm labor symptoms and general obstetric precautions including but not limited to vaginal bleeding, contractions, leaking of fluid and fetal movement were reviewed in detail with the patient.  I discussed the assessment and treatment plan with the patient. The patient was provided an opportunity to ask questions and all were answered. The patient agreed with the plan and demonstrated an understanding of the instructions. The patient was advised to call back or seek an in-person office evaluation/go to [facility] at [facility] for any urgent or concerning symptoms. Please refer to After Visit Summary for other counseling recommendations.   I provided 5 minutes of non-face-to-face time during this encounter. There was 5 minutes of chart review time spent prior to this encounter. Total time spent = 10 minutes.  Return in about 4 weeks (around 11/26/2020) for Return OB 2hr GTT.  Future Appointments  Date Time Provider Department Center  11/25/2020 10:15 AM [facility] NURSE [facility]  11/25/2020 10:30 AM [facility] US3 [facility]  11/26/2020  8:30 AM Raelyn Mora, CNM [facility] None  12/24/2020  1:50 PM Gerrit Heck, CNM [facility] None  01/07/2021  1:50 PM Raelyn Mora, CNM [facility] None    Raelyn Mora, [facility] for [facility], [place]

## 2020-10-29 NOTE — Patient Instructions (Signed)
Oral Glucose Tolerance Test During Pregnancy Why am I having this test? The oral glucose tolerance test (OGTT) is done to check how your body processes blood sugar (glucose). This is one of several tests used to diagnose diabetes that develops during pregnancy (gestational diabetes mellitus). Gestational diabetes is a short-term form of diabetes that some women develop while they are pregnant. It usually occurs during the second trimester of pregnancy and goes away after delivery. Testing, or screening, for gestational diabetes usually occurs at weeks 24-28 of pregnancy. You may have the OGTT test after having a 1-hour glucose screening test if the results from that test indicate that you may have gestational diabetes. This test may also be needed if:  You have a history of gestational diabetes.  There is a history of giving birth to very large babies or of losing pregnancies (having stillbirths).  You have signs and symptoms of diabetes, such as: ? Changes in your eyesight. ? Tingling or numbness in your hands or feet. ? Changes in hunger, thirst, and urination, and these are not explained by your pregnancy. What is being tested? This test measures the amount of glucose in your blood at different times during a period of 3 hours. This shows how well your body can process glucose. What kind of sample is taken? Blood samples are required for this test. They are usually collected by inserting a needle into a blood vessel.   How do I prepare for this test?  For 3 days before your test, eat normally. Have plenty of carbohydrate-rich foods.  Follow instructions from your health care provider about: ? Eating or drinking restrictions on the day of the test. You may be asked not to eat or drink anything other than water (to fast) starting 8-10 hours before the test. ? Changing or stopping your regular medicines. Some medicines may interfere with this test. Tell a health care provider about:  All  medicines you are taking, including vitamins, herbs, eye drops, creams, and over-the-counter medicines.  Any blood disorders you have.  Any surgeries you have had.  Any medical conditions you have. What happens during the test? First, your blood glucose will be measured. This is referred to as your fasting blood glucose because you fasted before the test. Then, you will drink a glucose solution that contains a certain amount of glucose. Your blood glucose will be measured again 1, 2, and 3 hours after you drink the solution. This test takes about 3 hours to complete. You will need to stay at the testing location during this time. During the testing period:  Do not eat or drink anything other than the glucose solution.  Do not exercise.  Do not use any products that contain nicotine or tobacco, such as cigarettes, e-cigarettes, and chewing tobacco. These can affect your test results. If you need help quitting, ask your health care provider. The testing procedure may vary among health care providers and hospitals. How are the results reported? Your results will be reported as milligrams of glucose per deciliter of blood (mg/dL) or millimoles per liter (mmol/L). There is more than one source for screening and diagnosis reference values used to diagnose gestational diabetes. Your health care provider will compare your results to normal values that were established after testing a large group of people (reference values). Reference values may vary among labs and hospitals. For this test ([place]arpenter-Coustan), reference values are:  Fasting: 95 mg/dL (5.3 mmol/L).  1 hour: 180 mg/dL (10.0 mmol/L).  2 hour:   155 mg/dL (8.6 mmol/L).  3 hour: 140 mg/dL (7.8 mmol/L). What do the results mean? Results below the reference values are considered normal. If two or more of your blood glucose levels are at or above the reference values, you may be diagnosed with gestational diabetes. If only one level is  high, your health care provider may suggest repeat testing or other tests to confirm a diagnosis. Talk with your health care provider about what your results mean. Questions to ask your health care provider Ask your health care provider, or the department that is doing the test:  When will my results be ready?  How will I get my results?  What are my treatment options?  What other tests do I need?  What are my next steps? Summary  The oral glucose tolerance test (OGTT) is one of several tests used to diagnose diabetes that develops during pregnancy (gestational diabetes mellitus). Gestational diabetes is a short-term form of diabetes that some women develop while they are pregnant.  You may have the OGTT test after having a 1-hour glucose screening test if the results from that test show that you may have gestational diabetes. You may also have this test if you have any symptoms or risk factors for this type of diabetes.  Talk with your health care provider about what your results mean. This information is not intended to replace advice given to you by your health care provider. Make sure you discuss any questions you have with your health care provider. Document Revised: 11/07/2019 Document Reviewed: 11/07/2019 Elsevier Patient Education  2021 [employer]  

## 2020-11-04 ENCOUNTER — Ambulatory Visit: Payer: Medicaid Other

## 2020-11-11 ENCOUNTER — Ambulatory Visit: Payer: Medicaid Other

## 2020-11-18 ENCOUNTER — Ambulatory Visit: Payer: Medicaid Other

## 2020-11-25 ENCOUNTER — Ambulatory Visit: Payer: Medicaid Other | Admitting: *Deleted

## 2020-11-25 ENCOUNTER — Ambulatory Visit: Payer: Medicaid Other

## 2020-11-25 ENCOUNTER — Other Ambulatory Visit: Payer: Self-pay

## 2020-11-25 ENCOUNTER — Encounter: Payer: Self-pay | Admitting: *Deleted

## 2020-11-25 ENCOUNTER — Ambulatory Visit: Payer: Medicaid Other | Attending: Maternal & Fetal Medicine

## 2020-11-25 VITALS — BP 127/63 | HR 107

## 2020-11-25 DIAGNOSIS — Z3480 Encounter for supervision of other normal pregnancy, unspecified trimester: Secondary | ICD-10-CM

## 2020-11-25 DIAGNOSIS — O26619 Liver and biliary tract disorders in pregnancy, unspecified trimester: Secondary | ICD-10-CM | POA: Insufficient documentation

## 2020-11-25 DIAGNOSIS — O26612 Liver and biliary tract disorders in pregnancy, second trimester: Secondary | ICD-10-CM | POA: Insufficient documentation

## 2020-11-25 DIAGNOSIS — K831 Obstruction of bile duct: Secondary | ICD-10-CM

## 2020-11-25 DIAGNOSIS — O26613 Liver and biliary tract disorders in pregnancy, third trimester: Secondary | ICD-10-CM

## 2020-11-25 DIAGNOSIS — Z148 Genetic carrier of other disease: Secondary | ICD-10-CM

## 2020-11-25 DIAGNOSIS — Z3A28 28 weeks gestation of pregnancy: Secondary | ICD-10-CM

## 2020-11-25 DIAGNOSIS — Z362 Encounter for other antenatal screening follow-up: Secondary | ICD-10-CM | POA: Diagnosis not present

## 2020-11-26 ENCOUNTER — Other Ambulatory Visit: Payer: Self-pay | Admitting: *Deleted

## 2020-11-26 ENCOUNTER — Encounter: Payer: Self-pay | Admitting: Obstetrics and Gynecology

## 2020-11-26 ENCOUNTER — Ambulatory Visit (INDEPENDENT_AMBULATORY_CARE_PROVIDER_SITE_OTHER): Payer: Medicaid Other | Admitting: Obstetrics and Gynecology

## 2020-11-26 VITALS — BP 104/69 | HR 92 | Wt 197.0 lb

## 2020-11-26 DIAGNOSIS — O26619 Liver and biliary tract disorders in pregnancy, unspecified trimester: Secondary | ICD-10-CM

## 2020-11-26 DIAGNOSIS — Z3A28 28 weeks gestation of pregnancy: Secondary | ICD-10-CM

## 2020-11-26 DIAGNOSIS — K831 Obstruction of bile duct: Secondary | ICD-10-CM

## 2020-11-26 DIAGNOSIS — Z3480 Encounter for supervision of other normal pregnancy, unspecified trimester: Secondary | ICD-10-CM

## 2020-11-26 DIAGNOSIS — O26613 Liver and biliary tract disorders in pregnancy, third trimester: Secondary | ICD-10-CM

## 2020-11-26 NOTE — Patient Instructions (Signed)
Third Trimester of Pregnancy The third trimester of pregnancy is from week 28 through week 40. This is months 7 through 9. The third trimester is a time when the unborn baby (fetus) is growing rapidly. At the end of the ninth month, the fetus is about 20inches long and weighs 6-10 pounds. Body changes during your third trimester During the third trimester, your body will continue to go through many changes.The changes vary and generally return to normal after your baby is born. Physical changes Your weight will continue to increase. You can expect to gain 25-35 pounds (11-16 kg) by the end of the pregnancy if you begin pregnancy at a normal weight. If you are underweight, you can expect to gain 28-40 lb (about 13-18 kg), and if you are overweight, you can expect to gain 15-25 lb (about 7-11 kg). You may begin to get stretch marks on your hips, abdomen, and breasts. Your breasts will continue to grow and may hurt. A yellow fluid (colostrum) may leak from your breasts. This is the first milk you are producing for your baby. You may have changes in your hair. These can include thickening of your hair, rapid growth, and changes in texture. Some people also have hair loss during or after pregnancy, or hair that feels dry or thin. Your belly button may stick out. You may notice more swelling in your hands, face, or ankles. Health changes You may have heartburn. You may have constipation. You may develop hemorrhoids. You may develop swollen, bulging veins (varicose veins) in your legs. You may have increased body aches in the pelvis, back, or thighs. This is due to weight gain and increased hormones that are relaxing your joints. You may have increased tingling or numbness in your hands, arms, and legs. The skin on your abdomen may also feel numb. You may feel short of breath because of your expanding uterus. Other changes You may urinate more often because the fetus is moving lower into your pelvis and  pressing on your bladder. You may have more problems sleeping. This may be caused by the size of your abdomen, an increased need to urinate, and an increase in your body's metabolism. You may notice the fetus "dropping," or moving lower in your abdomen (lightening). You may have increased vaginal discharge. You may notice that you have pain around your pelvic bone as your uterus distends. Follow these instructions at home: Medicines Follow your health care provider's instructions regarding medicine use. Specific medicines may be either safe or unsafe to take during pregnancy. Do not take any medicines unless approved by your health care provider. Take a prenatal vitamin that contains at least 600 micrograms (mcg) of folic acid. Eating and drinking Eat a healthy diet that includes fresh fruits and vegetables, whole grains, good sources of protein such as meat, eggs, or tofu, and low-fat dairy products. Avoid raw meat and unpasteurized juice, milk, and cheese. These carry germs that can harm you and your baby. Eat 4 or 5 small meals rather than 3 large meals a day. You may need to take these actions to prevent or treat constipation: Drink enough fluid to keep your urine pale yellow. Eat foods that are high in fiber, such as beans, whole grains, and fresh fruits and vegetables. Limit foods that are high in fat and processed sugars, such as fried or sweet foods. Activity Exercise only as directed by your health care provider. Most people can continue their usual exercise routine during pregnancy. Try to exercise for  exercise for 30 minutes at least 5 days a week. Stop exercising if you experience contractions in the uterus. °Stop exercising if you develop pain or cramping in the lower abdomen or lower back. °Avoid heavy lifting. °Do not exercise if it is very hot or humid or if you are at a high altitude. °If you choose to, you may continue to have sex unless your health care provider tells you not  to. °Relieving pain and discomfort °Take frequent breaks and rest with your legs raised (elevated) if you have leg cramps or low back pain. °Take warm sitz baths to soothe any pain or discomfort caused by hemorrhoids. Use hemorrhoid cream if your health care provider approves. °Wear a supportive bra to prevent discomfort from breast tenderness. °If you develop varicose veins: °Wear support hose as told by your health care provider. °Elevate your feet for 15 minutes, 3-4 times a day. °Limit salt in your diet. °Safety °Talk to your health care provider before traveling far distances. °Do not use hot tubs, steam rooms, or saunas. °Wear your seat belt at all times when driving or riding in a car. °Talk with your health care provider if someone is verbally or physically abusive to you. °Preparing for birth °To prepare for the arrival of your baby: °Take prenatal classes to understand, practice, and ask questions about labor and delivery. °Visit the hospital and tour the maternity area. °Purchase a rear-facing car seat and make sure you know how to install it in your car. °Prepare the baby's room or sleeping area. Make sure to remove all pillows and stuffed animals from the baby's crib to prevent suffocation. °General instructions °Avoid cat litter boxes and soil used by cats. These carry germs that can cause birth defects in the baby. If you have a cat, ask someone to clean the litter box for you. °Do not douche or use tampons. Do not use scented sanitary pads. °Do not use any products that contain nicotine or tobacco, such as cigarettes, e-cigarettes, and chewing tobacco. If you need help quitting, ask your health care provider. °Do not use any herbal remedies, illegal drugs, or medicines that were not prescribed to you. Chemicals in these products can harm your baby. °Do not drink alcohol. °You will have more frequent prenatal exams during the third trimester. During a routine prenatal visit, your health care provider  will do a physical exam, perform tests, and discuss your overall health. Keep all follow-up visits. This is important. °Where to find more information °American Pregnancy Association: americanpregnancy.org °American College of Obstetricians and Gynecologists: acog.org/en/Womens%20Health/Pregnancy °Office on Women's Health: womenshealth.gov/pregnancy °Contact a health care provider if you have: °A fever. °Mild pelvic cramps, pelvic pressure, or nagging pain in your abdominal area or lower back. °Vomiting or diarrhea. °Bad-smelling vaginal discharge or foul-smelling urine. °Pain when you urinate. °A headache that does not go away when you take medicine. °Visual changes or see spots in front of your eyes. °Get help right away if: °Your water breaks. °You have regular contractions less than 5 minutes apart. °You have spotting or bleeding from your vagina. °You have severe abdominal pain. °You have difficulty breathing. °You have chest pain. °You have fainting spells. °You have not felt your baby move for the time period told by your health care provider. °You have new or increased pain, swelling, or redness in an arm or leg. °Summary °The third trimester of pregnancy is from week 28 through week 40 (months 7 through 9). °You may have more problems sleeping.   This can be caused by the size of your abdomen, an increased need to urinate, and an increase in your body's metabolism. °You will have more frequent prenatal exams during the third trimester. Keep all follow-up visits. This is important. °This information is not intended to replace advice given to you by your health care provider. Make sure you discuss any questions you have with your health care provider. °Document Revised: 11/06/2019 Document Reviewed: 09/12/2019 °Elsevier Patient Education © 2022 [employer] °Fetal Movement Counts °Patient Name: ________________________________________________ Patient Due Date: ____________________ °What is a fetal movement  count? °A fetal movement count is the number of times that you feel your baby move during a certain amount of time. This may also be called a fetal kick count. A fetal movement count is recommended for every pregnant woman. You may be asked to start counting fetal movements as early as week 28 of your pregnancy. °Pay attention to when your baby is most active. You may notice your baby's sleep and wake cycles. You may also notice things that make your baby move more. You should do a fetal movement count: °When your baby is normally most active. °At the same time each day. °A good time to count movements is while you are resting, after having something to eat and drink. °How do I count fetal movements? °Find a quiet, comfortable area. Sit, or lie down on your side. °Write down the date, the start time and stop time, and the number of movements that you felt between those two times. Take this information with you to your health care visits. °Write down your start time when you feel the first movement. °Count kicks, flutters, swishes, rolls, and jabs. You should feel at least 10 movements. °You may stop counting after you have felt 10 movements, or if you have been counting for 2 hours. Write down the stop time. °If you do not feel 10 movements in 2 hours, contact your health care provider for further instructions. Your health care provider may want to do additional tests to assess your baby's well-being. °Contact a health care provider if: °You feel fewer than 10 movements in 2 hours. °Your baby is not moving like he or she usually does. °Date: ____________ Start time: ____________ Stop time: ____________ Movements: ____________ °Date: ____________ Start time: ____________ Stop time: ____________ Movements: ____________ °Date: ____________ Start time: ____________ Stop time: ____________ Movements: ____________ °Date: ____________ Start time: ____________ Stop time: ____________ Movements: ____________ °Date:  ____________ Start time: ____________ Stop time: ____________ Movements: ____________ °Date: ____________ Start time: ____________ Stop time: ____________ Movements: ____________ °Date: ____________ Start time: ____________ Stop time: ____________ Movements: ____________ °Date: ____________ Start time: ____________ Stop time: ____________ Movements: ____________ °Date: ____________ Start time: ____________ Stop time: ____________ Movements: ____________ °This information is not intended to replace advice given to you by your health care provider. Make sure you discuss any questions you have with your health care provider. °Document Revised: 01/17/2019 Document Reviewed: 01/17/2019 °Elsevier Patient Education © 2022 [employer] °Iron-Rich Diet °Iron is a mineral that helps your body produce hemoglobin. Hemoglobin is a protein in red blood cells that carries oxygen to your body's tissues. Eating too little iron may cause you to feel weak and tired, and it can increase your risk of infection. Iron is naturally found in many foods, and many foods have iron added to them (are iron-fortified). °You may need to follow an iron-rich diet if you do not have enough iron in your body due to certain medical   conditions. The amount of iron that you need each day depends on your age, your sex, and any medical conditions you have. Follow instructions from your health care provider or a dietitian about how much iron you should eat each day. °What are tips for following this plan? °Reading food labels °Check food labels to see how many milligrams (mg) of iron are in each serving. °Cooking °Cook foods in pots and pans that are made from iron. °Take these steps to make it easier for your body to absorb iron from certain foods: °Soak beans overnight before cooking. °Soak whole grains overnight and drain them before using. °Ferment flours before baking, such as by using yeast in bread dough. °Meal planning °When you eat foods that  contain iron, you should eat them with foods that are high in vitamin C. These include oranges, peppers, tomatoes, potatoes, and mangoes. Vitamin C helps your body absorb iron. °Certain foods and drinks prevent your body from absorbing iron properly. Avoid eating these foods in the same meal as iron-rich foods or with iron supplements. These foods include: °Coffee, black tea, and red wine. °Milk, dairy products, and foods that are high in calcium. °Beans and soybeans. °Whole grains. °General information °Take iron supplements only as told by your health care provider. An overdose of iron can be life-threatening. If you were prescribed iron supplements, take them with orange juice or a vitamin C supplement. °When you eat iron-fortified foods or take an iron supplement, you should also eat foods that naturally contain iron, such as meat, poultry, and fish. Eating naturally iron-rich foods helps your body absorb the iron that is added to other foods or contained in a supplement. °Iron from animal sources is better absorbed than iron from plant sources. °What foods should I eat? °Fruits °Prunes. Raisins. °Eat fruits high in vitamin C, such as oranges, grapefruits, and strawberries, with iron-rich foods. °Vegetables °Spinach (cooked). Green peas. Broccoli. Fermented vegetables. °Eat vegetables high in vitamin C, such as leafy greens, potatoes, bell peppers, and tomatoes, with iron-rich foods. °Grains °Iron-fortified breakfast cereal. Iron-fortified whole-wheat bread. Enriched rice. Sprouted grains. °Meats and other proteins °Beef liver. Beef. Turkey. Chicken. Oysters. Shrimp. Tuna. Sardines. Chickpeas. Nuts. Tofu. Pumpkin seeds. °Beverages °Tomato juice. Fresh orange juice. Prune juice. Hibiscus tea. Iron-fortified instant breakfast shakes. °Sweets and desserts °Blackstrap molasses. °Seasonings and condiments °Tahini. Fermented soy sauce. °Other foods °Wheat germ. °The items listed above may not be a complete list of  recommended foods and beverages. Contact a dietitian for more information. °What foods should I limit? °These are foods that should be limited while eating iron-rich foods as they can reduce the absorption of iron in your body. °Grains °Whole grains. Bran cereal. Bran flour. °Meats and other proteins °Soybeans. Products made from soy protein. Black beans. Lentils. Mung beans. Split peas. °Dairy °Milk. Cream. Cheese. Yogurt. Cottage cheese. °Beverages °Coffee. Black tea. Red wine. °Sweets and desserts °Cocoa. Chocolate. Ice cream. °Seasonings and condiments °Basil. Oregano. Large amounts of parsley. °The items listed above may not be a complete list of foods and beverages you should limit. Contact a dietitian for more information. °Summary °Iron is a mineral that helps your body produce hemoglobin. Hemoglobin is a protein in red blood cells that carries oxygen to your body's tissues. °Iron is naturally found in many foods, and many foods have iron added to them (are iron-fortified). °When you eat foods that contain iron, you should eat them with foods that are high in vitamin C. Vitamin C helps your   body absorb iron. °Certain foods and drinks prevent your body from absorbing iron properly, such as whole grains and dairy products. You should avoid eating these foods in the same meal as iron-rich foods or with iron supplements. °This information is not intended to replace advice given to you by your health care provider. Make sure you discuss any questions you have with your health care provider. °Document Revised: 05/11/2020 Document Reviewed: 05/11/2020 °Elsevier Patient Education © 2022 [employer] Inc. ° °

## 2020-11-26 NOTE — Progress Notes (Signed)
PHQ: Score 0 GAD: Score 0 

## 2020-11-26 NOTE — Progress Notes (Addendum)
   LOW-RISK PREGNANCY OFFICE VISIT Patient name: Dorothy Olsen MRN [id number]  Date of birth: 1990-07-12 Chief Complaint:   Routine Prenatal Visit  History of Present Illness:   Dorothy Olsen is a 30 y.o. G45P1001 female at [redacted]w[redacted]d with an Estimated Date of Delivery: 02/16/21 being seen today for ongoing management of a low-risk pregnancy.  Today she reports no complaints. Contractions: Not present. Vag. Bleeding: None.  Movement: Present. denies leaking of fluid. Review of Systems:   Pertinent items are noted in HPI Denies abnormal vaginal discharge w/ itching/odor/irritation, headaches, visual changes, shortness of breath, chest pain, abdominal pain, severe nausea/vomiting, or problems with urination or bowel movements unless otherwise stated above. Pertinent History Reviewed:  Reviewed past medical,surgical, social, obstetrical and family history.  Reviewed problem list, medications and allergies. Physical Assessment:   Vitals:   11/26/20 0828  BP: 104/69  Pulse: 92  Weight: 197 lb (89.4 kg)  Body mass index is 31.8 kg/m.        Physical Examination:   General appearance: Well appearing, and in no distress  Mental status: Alert, oriented to person, place, and time  Skin: Warm & dry  Cardiovascular: Normal heart rate noted  Respiratory: Normal respiratory effort, no distress  Abdomen: Soft, gravid, nontender  Pelvic: Cervical exam deferred         Extremities: Edema: None  Fetal Status: Fetal Heart Rate (bpm): 142   Movement: Present    No results found for this or any previous visit (from the past 24 hour(s)).  Assessment & Plan:  1) Low-risk pregnancy G2P1001 at [redacted]w[redacted]d with an Estimated Date of Delivery: 02/16/21   2) Supervision of other normal pregnancy, antepartum  - Glucose Tolerance, 2 Hours w/1 Hour,  - HIV antibody (with reflex),  - CBC,  - RPR - Anticipatory guidance for next 2 visits via MyChart, then 36 wks in-person  3) Cholestasis of pregnancy, antepartum -  Asymptomatic - Taking Actigall daily - Will start NSTs and weekly U/S per protocol set by Dr. Judeth Cornfield  4) [redacted] weeks gestation of pregnancy   Meds: No orders of the defined types were placed in this encounter.  Labs/procedures today: 2 hr GTT and 3rd trimester labs  Plan:  Continue routine obstetrical care   Reviewed: Preterm labor symptoms and general obstetric precautions including but not limited to vaginal bleeding, contractions, leaking of fluid and fetal movement were reviewed in detail with the patient.  All questions were answered. Has home bp cuff. Check bp weekly, let us know if >140/90.   Follow-up: Return in about 4 weeks (around 12/24/2020) for Return OB - My Chart video.  Orders Placed This Encounter  Procedures   Glucose Tolerance, 2 Hours w/1 Hour   HIV antibody (with reflex)   CBC   RPR   Raelyn Mora MSN, CNM 11/26/2020 8:46 AM

## 2020-11-27 LAB — GLUCOSE TOLERANCE, 2 HOURS W/ 1HR
Glucose, 1 hour: 97 mg/dL (ref 65–179)
Glucose, 2 hour: 84 mg/dL (ref 65–152)
Glucose, Fasting: 73 mg/dL (ref 65–91)

## 2020-11-27 LAB — CBC
Hematocrit: 34.4 % (ref 34.0–46.6)
Hemoglobin: 11.1 g/dL (ref 11.1–15.9)
MCH: 28.7 pg (ref 26.6–33.0)
MCHC: 32.3 g/dL (ref 31.5–35.7)
MCV: 89 fL (ref 79–97)
Platelets: 148 10*3/uL — ABNORMAL LOW (ref 150–450)
RBC: 3.87 x10E6/uL (ref 3.77–5.28)
RDW: 13.8 % (ref 11.7–15.4)
WBC: 6.7 10*3/uL (ref 3.4–10.8)

## 2020-11-27 LAB — HIV ANTIBODY (ROUTINE TESTING W REFLEX): HIV Screen 4th Generation wRfx: NONREACTIVE

## 2020-11-27 LAB — RPR: RPR Ser Ql: NONREACTIVE

## 2020-12-02 ENCOUNTER — Encounter: Payer: Self-pay | Admitting: *Deleted

## 2020-12-02 ENCOUNTER — Other Ambulatory Visit: Payer: Self-pay

## 2020-12-02 ENCOUNTER — Ambulatory Visit: Payer: Medicaid Other

## 2020-12-02 ENCOUNTER — Ambulatory Visit: Payer: Medicaid Other | Attending: Obstetrics and Gynecology | Admitting: *Deleted

## 2020-12-02 ENCOUNTER — Ambulatory Visit: Payer: Medicaid Other | Admitting: *Deleted

## 2020-12-02 VITALS — BP 117/64 | HR 83

## 2020-12-02 DIAGNOSIS — Z3A29 29 weeks gestation of pregnancy: Secondary | ICD-10-CM | POA: Insufficient documentation

## 2020-12-02 DIAGNOSIS — O26619 Liver and biliary tract disorders in pregnancy, unspecified trimester: Secondary | ICD-10-CM

## 2020-12-02 DIAGNOSIS — K831 Obstruction of bile duct: Secondary | ICD-10-CM | POA: Insufficient documentation

## 2020-12-02 DIAGNOSIS — O26613 Liver and biliary tract disorders in pregnancy, third trimester: Secondary | ICD-10-CM | POA: Insufficient documentation

## 2020-12-02 DIAGNOSIS — Z3480 Encounter for supervision of other normal pregnancy, unspecified trimester: Secondary | ICD-10-CM

## 2020-12-02 NOTE — Procedures (Signed)
Massa Pe 1991/03/08 [redacted]w[redacted]d  Fetus A Non-Stress Test Interpretation for 12/02/20  Indication:  cholestasis  Fetal Heart Rate A Mode: External Baseline Rate (A): 140 bpm Variability: Moderate Accelerations: 10 x 10 Decelerations: Variable Multiple birth?: No  Uterine Activity Mode: Palpation, Toco Contraction Frequency (min): none Resting Tone Palpated: Relaxed Resting Time: Adequate  Interpretation (Fetal Testing) Nonstress Test Interpretation: Reactive Overall Impression: Reassuring for gestational age Comments: Dr. Grace Bushy reviewed tracing.

## 2020-12-09 ENCOUNTER — Other Ambulatory Visit: Payer: Self-pay

## 2020-12-09 ENCOUNTER — Ambulatory Visit: Payer: Medicaid Other | Attending: Obstetrics and Gynecology | Admitting: *Deleted

## 2020-12-09 ENCOUNTER — Ambulatory Visit: Payer: Medicaid Other | Admitting: *Deleted

## 2020-12-09 ENCOUNTER — Encounter: Payer: Self-pay | Admitting: *Deleted

## 2020-12-09 VITALS — BP 120/63 | HR 88

## 2020-12-09 DIAGNOSIS — Z148 Genetic carrier of other disease: Secondary | ICD-10-CM

## 2020-12-09 DIAGNOSIS — K831 Obstruction of bile duct: Secondary | ICD-10-CM

## 2020-12-09 DIAGNOSIS — Z3A30 30 weeks gestation of pregnancy: Secondary | ICD-10-CM | POA: Insufficient documentation

## 2020-12-09 DIAGNOSIS — O26613 Liver and biliary tract disorders in pregnancy, third trimester: Secondary | ICD-10-CM | POA: Insufficient documentation

## 2020-12-09 DIAGNOSIS — Z3A28 28 weeks gestation of pregnancy: Secondary | ICD-10-CM

## 2020-12-09 DIAGNOSIS — Z3480 Encounter for supervision of other normal pregnancy, unspecified trimester: Secondary | ICD-10-CM

## 2020-12-09 NOTE — Procedures (Signed)
Dorothy Olsen [birth year] [redacted]w[redacted]d  Fetus A Non-Stress Test Interpretation for 12/09/20  Indication:  Alpha Thal Carrier, Cholestasis  Fetal Heart Rate A Mode: External Baseline Rate (A): 140 bpm Variability: Moderate Accelerations: 15 x 15 Decelerations: None Multiple birth?: No  Uterine Activity Mode: Palpation, Toco Contraction Frequency (min): none Resting Tone Palpated: Relaxed Resting Time: Adequate  Interpretation (Fetal Testing) Nonstress Test Interpretation: Reactive Overall Impression: Reassuring for gestational age Comments: Dr. Judeth Cornfield reviewed tracing.

## 2020-12-16 ENCOUNTER — Ambulatory Visit: Payer: Medicaid Other | Admitting: *Deleted

## 2020-12-16 ENCOUNTER — Ambulatory Visit: Payer: Medicaid Other | Attending: Obstetrics and Gynecology

## 2020-12-16 ENCOUNTER — Encounter: Payer: Self-pay | Admitting: *Deleted

## 2020-12-16 ENCOUNTER — Other Ambulatory Visit: Payer: Self-pay

## 2020-12-16 VITALS — BP 106/58 | HR 86

## 2020-12-16 DIAGNOSIS — Z3480 Encounter for supervision of other normal pregnancy, unspecified trimester: Secondary | ICD-10-CM | POA: Insufficient documentation

## 2020-12-16 DIAGNOSIS — K831 Obstruction of bile duct: Secondary | ICD-10-CM | POA: Diagnosis present

## 2020-12-16 DIAGNOSIS — O26619 Liver and biliary tract disorders in pregnancy, unspecified trimester: Secondary | ICD-10-CM | POA: Insufficient documentation

## 2020-12-16 DIAGNOSIS — O26613 Liver and biliary tract disorders in pregnancy, third trimester: Secondary | ICD-10-CM | POA: Diagnosis present

## 2020-12-16 NOTE — Procedures (Signed)
Dorothy Olsen 02/03/1991 [redacted]w[redacted]d  Fetus A Non-Stress Test Interpretation for 12/16/20  Indication:  Cholestasis  Fetal Heart Rate A Mode: External Baseline Rate (A): 140 bpm Variability: Moderate Accelerations: 15 x 15 Decelerations: None Multiple birth?: No  Uterine Activity Mode: Palpation, Toco Contraction Frequency (min): none Resting Tone Palpated: Relaxed Resting Time: Adequate  Interpretation (Fetal Testing) Nonstress Test Interpretation: Reactive Overall Impression: Reassuring for gestational age Comments: Dr. Judeth Cornfield reviewed tracing.

## 2020-12-17 ENCOUNTER — Other Ambulatory Visit: Payer: Self-pay | Admitting: *Deleted

## 2020-12-17 DIAGNOSIS — O26613 Liver and biliary tract disorders in pregnancy, third trimester: Secondary | ICD-10-CM

## 2020-12-17 DIAGNOSIS — K831 Obstruction of bile duct: Secondary | ICD-10-CM

## 2020-12-23 ENCOUNTER — Other Ambulatory Visit: Payer: Self-pay

## 2020-12-23 ENCOUNTER — Encounter: Payer: Self-pay | Admitting: *Deleted

## 2020-12-23 ENCOUNTER — Ambulatory Visit: Payer: Medicaid Other | Attending: Obstetrics and Gynecology

## 2020-12-23 ENCOUNTER — Ambulatory Visit (HOSPITAL_BASED_OUTPATIENT_CLINIC_OR_DEPARTMENT_OTHER): Payer: Medicaid Other | Admitting: *Deleted

## 2020-12-23 ENCOUNTER — Ambulatory Visit: Payer: Medicaid Other | Admitting: *Deleted

## 2020-12-23 VITALS — BP 105/60 | HR 86

## 2020-12-23 DIAGNOSIS — K831 Obstruction of bile duct: Secondary | ICD-10-CM

## 2020-12-23 DIAGNOSIS — O26619 Liver and biliary tract disorders in pregnancy, unspecified trimester: Secondary | ICD-10-CM | POA: Diagnosis present

## 2020-12-23 DIAGNOSIS — Z362 Encounter for other antenatal screening follow-up: Secondary | ICD-10-CM | POA: Diagnosis not present

## 2020-12-23 DIAGNOSIS — Z3A32 32 weeks gestation of pregnancy: Secondary | ICD-10-CM

## 2020-12-23 DIAGNOSIS — Z3480 Encounter for supervision of other normal pregnancy, unspecified trimester: Secondary | ICD-10-CM | POA: Diagnosis present

## 2020-12-23 DIAGNOSIS — O26613 Liver and biliary tract disorders in pregnancy, third trimester: Secondary | ICD-10-CM

## 2020-12-23 DIAGNOSIS — Z148 Genetic carrier of other disease: Secondary | ICD-10-CM

## 2020-12-23 NOTE — Procedures (Signed)
Dorothy Olsen 06-04-1991 [redacted]w[redacted]d  Fetus A Non-Stress Test Interpretation for 12/23/20  Indication:  Cholestasis  Fetal Heart Rate A Mode: External Baseline Rate (A): 135 bpm Variability: Moderate Accelerations: 15 x 15 Decelerations: None Multiple birth?: No  Uterine Activity Mode: Palpation, Toco Contraction Frequency (min): none Resting Tone Palpated: Relaxed Resting Time: Adequate  Interpretation (Fetal Testing) Nonstress Test Interpretation: Reactive Overall Impression: Reassuring for gestational age Comments: Dr. Grace Bushy reviewed tracing.

## 2020-12-24 ENCOUNTER — Telehealth (INDEPENDENT_AMBULATORY_CARE_PROVIDER_SITE_OTHER): Payer: Medicaid Other

## 2020-12-24 VITALS — BP 105/60 | HR 86 | Wt 199.0 lb

## 2020-12-24 DIAGNOSIS — Z3A32 32 weeks gestation of pregnancy: Secondary | ICD-10-CM

## 2020-12-24 DIAGNOSIS — K831 Obstruction of bile duct: Secondary | ICD-10-CM

## 2020-12-24 DIAGNOSIS — Z3480 Encounter for supervision of other normal pregnancy, unspecified trimester: Secondary | ICD-10-CM

## 2020-12-24 DIAGNOSIS — O26613 Liver and biliary tract disorders in pregnancy, third trimester: Secondary | ICD-10-CM

## 2020-12-24 NOTE — Progress Notes (Signed)
TELEHEALTH OBSTETRICS VISIT ENCOUNTER NOTE  Provider location: Center for [facility]   Patient location: Home  I connected with Dorothy Olsen on 12/24/20 at  1:50 PM EDT by telephone at home and verified that I am speaking with the correct person using two identifiers. Of note, unable to do video encounter due to technical difficulties.    I discussed the limitations, risks, security and privacy concerns of performing an evaluation and management service by telephone and the availability of in person appointments. I also discussed with the patient that there may be a patient responsible charge related to this service. The patient expressed understanding and agreed to proceed.  Subjective:  Dorothy Olsen is a 30 y.o. G2P1001 at [redacted]w[redacted]d being followed for ongoing prenatal care.  She is currently monitored for the following issues for this high-risk pregnancy and has Cholestasis of pregnancy, antepartum and Supervision of other normal pregnancy, antepartum on their problem list.  Patient reports no complaints. She requests review of her last [place]. Reports fetal movement. Denies any contractions, bleeding or leaking of fluid.   The following portions of the patient's history were reviewed and updated as appropriate: allergies, current medications, past family history, past medical history, past social history, past surgical history and problem list.   Objective:  Blood pressure 105/60, pulse 86, weight 199 lb (90.3 kg), last menstrual period 05/12/2020, currently breastfeeding. General:  Alert, oriented and cooperative.   Mental Status: Normal mood and affect perceived. Normal judgment and thought content.  Rest of physical exam deferred due to type of encounter  Assessment and Plan:  Pregnancy: G2P1001 at [redacted]w[redacted]d 1. Supervision of other normal pregnancy, antepartum -Anticipatory guidance for upcoming appts. -Patient to schedule next appt in 4 weeks for an in-person  visit. -Educated on GBS bacteria including what it is, why we test, and how and when we treat if needed. -Will test at next appt.   2. Cholestasis of pregnancy, antepartum -Taking Actigall daily -No itching.  -Discussed IOL. -Patient expresses concern with foley bulb placement. -Reviewed benefits of placement.  Informed that it is her right to refuse placement, but could prolong induction process. -Encouraged to ask for pain medications/epidural prior to placement to promote increased comfort.  -Request for IOL for Tuesday August 16th. -Admit orders, less GBS Prophylaxis placed.   3. [redacted] weeks gestation of pregnancy -Doing well. -Reviewed [place] results. -Informed of continued testing. -Reassured that it is normal to have variations in AFI. -Encouraged increased hydration.   Preterm labor symptoms and general obstetric precautions including but not limited to vaginal bleeding, contractions, leaking of fluid and fetal movement were reviewed in detail with the patient.  I discussed the assessment and treatment plan with the patient. The patient was provided an opportunity to ask questions and all were answered. The patient agreed with the plan and demonstrated an understanding of the instructions. The patient was advised to call back or seek an in-person office evaluation/go to [facility] at [facility] for any urgent or concerning symptoms. Please refer to After Visit Summary for other counseling recommendations.   I provided 8 minutes of non-face-to-face time during this encounter.  No follow-ups on file.  Future Appointments  Date Time Provider Department Center  01/01/2021  3:00 PM [facility] NURSE [facility]  01/01/2021  3:15 PM [facility] US2 [facility]  01/01/2021  4:00 PM [facility] NST [facility]  01/07/2021  7:15 AM [facility] NURSE [facility]  01/07/2021  7:30 AM [facility] US3 [facility]  [facility]  01/07/2021  8:45 AM [facility] NST [facility]  01/13/2021  2:15 PM [facility] NURSE [facility]   01/13/2021  2:30 PM [facility] US3 [facility]  01/13/2021  3:30 PM [facility] NST [facility]  01/20/2021 10:45 AM [facility] NURSE [facility]  01/20/2021 11:00 AM [facility] US1 [facility]  01/20/2021 12:00 PM [facility] NST [facility]  01/21/2021  1:30 PM Raelyn Mora, CNM [facility] None  02/04/2021  1:30 PM Raelyn Mora, CNM [facility] None    Cherre Robins, CNM Center for [facility]

## 2021-01-01 ENCOUNTER — Ambulatory Visit: Payer: Medicaid Other | Attending: Obstetrics and Gynecology

## 2021-01-01 ENCOUNTER — Ambulatory Visit: Payer: Medicaid Other | Admitting: *Deleted

## 2021-01-01 ENCOUNTER — Other Ambulatory Visit: Payer: Self-pay

## 2021-01-01 ENCOUNTER — Encounter: Payer: Self-pay | Admitting: *Deleted

## 2021-01-01 VITALS — BP 128/68 | HR 88

## 2021-01-01 DIAGNOSIS — Z3480 Encounter for supervision of other normal pregnancy, unspecified trimester: Secondary | ICD-10-CM | POA: Diagnosis present

## 2021-01-01 DIAGNOSIS — O26613 Liver and biliary tract disorders in pregnancy, third trimester: Secondary | ICD-10-CM

## 2021-01-01 DIAGNOSIS — K831 Obstruction of bile duct: Secondary | ICD-10-CM

## 2021-01-01 DIAGNOSIS — O26619 Liver and biliary tract disorders in pregnancy, unspecified trimester: Secondary | ICD-10-CM | POA: Insufficient documentation

## 2021-01-01 DIAGNOSIS — O26643 Intrahepatic cholestasis of pregnancy, third trimester: Secondary | ICD-10-CM

## 2021-01-01 NOTE — Procedures (Signed)
Dorothy Olsen 1991/02/10 [redacted]w[redacted]d  Fetus A Non-Stress Test Interpretation for 01/01/21  Indication:  cholestasis  Fetal Heart Rate A Mode: External Baseline Rate (A): 135 bpm Variability: Moderate Accelerations: 15 x 15 Decelerations: None Multiple birth?: No  Uterine Activity Mode: Palpation, Toco Contraction Frequency (min): none Resting Tone Palpated: Relaxed Resting Time: Adequate  Interpretation (Fetal Testing) Nonstress Test Interpretation: Reactive Overall Impression: Reassuring for gestational age Comments: Dr. Parke Poisson reviewed tracing.

## 2021-01-07 ENCOUNTER — Ambulatory Visit: Payer: Medicaid Other | Admitting: *Deleted

## 2021-01-07 ENCOUNTER — Other Ambulatory Visit: Payer: Self-pay

## 2021-01-07 ENCOUNTER — Encounter: Payer: Self-pay | Admitting: *Deleted

## 2021-01-07 ENCOUNTER — Encounter: Payer: Medicaid Other | Admitting: Obstetrics and Gynecology

## 2021-01-07 ENCOUNTER — Ambulatory Visit: Payer: Medicaid Other | Attending: Obstetrics and Gynecology

## 2021-01-07 VITALS — BP 119/70 | HR 87

## 2021-01-07 DIAGNOSIS — O26613 Liver and biliary tract disorders in pregnancy, third trimester: Secondary | ICD-10-CM | POA: Insufficient documentation

## 2021-01-07 DIAGNOSIS — K831 Obstruction of bile duct: Secondary | ICD-10-CM

## 2021-01-07 DIAGNOSIS — O26619 Liver and biliary tract disorders in pregnancy, unspecified trimester: Secondary | ICD-10-CM | POA: Insufficient documentation

## 2021-01-07 DIAGNOSIS — Z3480 Encounter for supervision of other normal pregnancy, unspecified trimester: Secondary | ICD-10-CM | POA: Diagnosis present

## 2021-01-07 NOTE — Procedures (Signed)
Dorothy Olsen 04/19/1991 [redacted]w[redacted]d  Fetus A Non-Stress Test Interpretation for 01/07/21  Indication:  cholestasis  Fetal Heart Rate A Mode: External Baseline Rate (A): 125 bpm Variability: Moderate Accelerations: 15 x 15 Decelerations: None Multiple birth?: No  Uterine Activity Mode: Palpation, Toco Contraction Frequency (min): none Resting Tone Palpated: Relaxed Resting Time: Adequate  Interpretation (Fetal Testing) Nonstress Test Interpretation: Reactive Overall Impression: Reassuring for gestational age Comments: Dr. Parke Poisson reviewed tracing

## 2021-01-13 ENCOUNTER — Ambulatory Visit: Payer: Medicaid Other | Admitting: *Deleted

## 2021-01-13 ENCOUNTER — Encounter: Payer: Self-pay | Admitting: *Deleted

## 2021-01-13 ENCOUNTER — Other Ambulatory Visit: Payer: Self-pay

## 2021-01-13 ENCOUNTER — Ambulatory Visit: Payer: Medicaid Other | Attending: Obstetrics and Gynecology

## 2021-01-13 VITALS — BP 116/67 | HR 73

## 2021-01-13 DIAGNOSIS — O26619 Liver and biliary tract disorders in pregnancy, unspecified trimester: Secondary | ICD-10-CM | POA: Insufficient documentation

## 2021-01-13 DIAGNOSIS — K831 Obstruction of bile duct: Secondary | ICD-10-CM | POA: Diagnosis present

## 2021-01-13 DIAGNOSIS — Z3480 Encounter for supervision of other normal pregnancy, unspecified trimester: Secondary | ICD-10-CM

## 2021-01-13 DIAGNOSIS — O26613 Liver and biliary tract disorders in pregnancy, third trimester: Secondary | ICD-10-CM | POA: Insufficient documentation

## 2021-01-13 DIAGNOSIS — O26643 Intrahepatic cholestasis of pregnancy, third trimester: Secondary | ICD-10-CM

## 2021-01-13 NOTE — Procedures (Signed)
Juan Olthoff 07/11/1990 [redacted]w[redacted]d  Fetus A Non-Stress Test Interpretation for 01/13/21  Indication:  Cholestasis  Fetal Heart Rate A Mode: External Baseline Rate (A): 135 bpm Variability: Moderate Accelerations: 15 x 15 Decelerations: None Multiple birth?: No  Uterine Activity Mode: Palpation, Toco Contraction Frequency (min): none Resting Tone Palpated: Relaxed Resting Time: Adequate  Interpretation (Fetal Testing) Nonstress Test Interpretation: Reactive Overall Impression: Reassuring for gestational age Comments: Dr. Judeth Cornfield reviewed tracing.

## 2021-01-19 ENCOUNTER — Telehealth (HOSPITAL_COMMUNITY): Payer: Self-pay | Admitting: *Deleted

## 2021-01-19 NOTE — Telephone Encounter (Signed)
Preadmission screen  

## 2021-01-20 ENCOUNTER — Encounter: Payer: Self-pay | Admitting: *Deleted

## 2021-01-20 ENCOUNTER — Other Ambulatory Visit: Payer: Self-pay

## 2021-01-20 ENCOUNTER — Telehealth (HOSPITAL_COMMUNITY): Payer: Self-pay | Admitting: *Deleted

## 2021-01-20 ENCOUNTER — Ambulatory Visit: Payer: Medicaid Other | Attending: Obstetrics and Gynecology

## 2021-01-20 ENCOUNTER — Ambulatory Visit: Payer: Medicaid Other | Admitting: *Deleted

## 2021-01-20 VITALS — BP 123/69 | HR 93

## 2021-01-20 DIAGNOSIS — Z3480 Encounter for supervision of other normal pregnancy, unspecified trimester: Secondary | ICD-10-CM | POA: Insufficient documentation

## 2021-01-20 DIAGNOSIS — K831 Obstruction of bile duct: Secondary | ICD-10-CM | POA: Diagnosis present

## 2021-01-20 DIAGNOSIS — O26619 Liver and biliary tract disorders in pregnancy, unspecified trimester: Secondary | ICD-10-CM | POA: Insufficient documentation

## 2021-01-20 DIAGNOSIS — O26613 Liver and biliary tract disorders in pregnancy, third trimester: Secondary | ICD-10-CM | POA: Diagnosis present

## 2021-01-20 DIAGNOSIS — Z3A36 36 weeks gestation of pregnancy: Secondary | ICD-10-CM | POA: Diagnosis not present

## 2021-01-20 DIAGNOSIS — Z148 Genetic carrier of other disease: Secondary | ICD-10-CM | POA: Diagnosis not present

## 2021-01-20 DIAGNOSIS — O26643 Intrahepatic cholestasis of pregnancy, third trimester: Secondary | ICD-10-CM

## 2021-01-20 NOTE — Procedures (Signed)
Dorothy Olsen 06/10/1991 [redacted]w[redacted]d  Fetus A Non-Stress Test Interpretation for 01/20/21  Indication:  Cholestasis  Fetal Heart Rate A Mode: External Baseline Rate (A): 140 bpm Variability: Moderate Accelerations: 15 x 15 Decelerations: None Multiple birth?: No  Uterine Activity Mode: Palpation, Toco Contraction Frequency (min): none Resting Tone Palpated: Relaxed Resting Time: Adequate  Interpretation (Fetal Testing) Nonstress Test Interpretation: Reactive Overall Impression: Reassuring for gestational age Comments: Dr. Parke Poisson reviewed tracing

## 2021-01-20 NOTE — Telephone Encounter (Signed)
Preadmission screen  

## 2021-01-21 ENCOUNTER — Ambulatory Visit (INDEPENDENT_AMBULATORY_CARE_PROVIDER_SITE_OTHER): Payer: Medicaid Other | Admitting: Obstetrics and Gynecology

## 2021-01-21 ENCOUNTER — Telehealth (HOSPITAL_COMMUNITY): Payer: Self-pay | Admitting: *Deleted

## 2021-01-21 ENCOUNTER — Other Ambulatory Visit (HOSPITAL_COMMUNITY)
Admission: RE | Admit: 2021-01-21 | Discharge: 2021-01-21 | Disposition: A | Discharge status: Home / Self Care | Payer: Medicaid Other | Source: Ambulatory Visit | Attending: Obstetrics and Gynecology | Admitting: Obstetrics and Gynecology

## 2021-01-21 VITALS — BP 110/72 | HR 96 | Temp 98.3°F | Wt 210.8 lb

## 2021-01-21 DIAGNOSIS — O26619 Liver and biliary tract disorders in pregnancy, unspecified trimester: Secondary | ICD-10-CM

## 2021-01-21 DIAGNOSIS — Z3A36 36 weeks gestation of pregnancy: Secondary | ICD-10-CM

## 2021-01-21 DIAGNOSIS — Z3480 Encounter for supervision of other normal pregnancy, unspecified trimester: Secondary | ICD-10-CM

## 2021-01-21 DIAGNOSIS — K831 Obstruction of bile duct: Secondary | ICD-10-CM

## 2021-01-21 NOTE — Telephone Encounter (Signed)
Preadmission screen  

## 2021-01-21 NOTE — Progress Notes (Signed)
  HIGH-RISK PREGNANCY OFFICE VISIT Patient name: Dorothy Olsen MRN [id number]  Date of birth: [birth year] Chief Complaint:   Routine Prenatal Visit  History of Present Illness:   Dorothy Olsen is a 30 y.o. G65P1001 female at [redacted]w[redacted]d with an Estimated Date of Delivery: 02/16/21 being seen today for ongoing management of a high-risk pregnancy complicated by intrahepatic cholestasis of pregnancy Today she reports no complaints. Contractions: Not present. Vag. Bleeding: None.  Movement: Present. denies leaking of fluid.  Review of Systems:   Pertinent items are noted in HPI Denies abnormal vaginal discharge w/ itching/odor/irritation, headaches, visual changes, shortness of breath, chest pain, abdominal pain, severe nausea/vomiting, or problems with urination or bowel movements unless otherwise stated above. Pertinent History Reviewed:  Reviewed past medical,surgical, social, obstetrical and family history.  Reviewed problem list, medications and allergies. Physical Assessment:   Vitals:   01/21/21 1349  BP: 110/72  Pulse: 96  Temp: 98.3 F (36.8 C)  Weight: 210 lb 12.8 oz (95.6 kg)  Body mass index is 34.02 kg/m.           Physical Examination:   General appearance: alert, well appearing, and in no distress, oriented to person, place, and time, and normal appearing weight  Mental status: alert, oriented to person, place, and time, normal mood, behavior, speech, dress, motor activity, and thought processes  Skin: warm & dry   Extremities: Edema: None    Cardiovascular: normal heart rate noted  Respiratory: normal respiratory effort, no distress  Abdomen: gravid, soft, non-tender  Pelvic: Cervical exam deferred         Fetal Status: Fetal Heart Rate (bpm): 136   Movement: Present    Fetal Surveillance Testing today: none   No results found for this or any previous visit (from the past 24 hour(s)).  Assessment & Plan:  1) High-risk pregnancy G2P1001 at [redacted]w[redacted]d with an Estimated Date of  Delivery: 02/16/21   2) Supervision of other normal pregnancy, antepartum  - Cervicovaginal ancillary only( [place]),  - Culture, beta strep (group b only)  3) Cholestasis of pregnancy, antepartum  4) [redacted] weeks gestation of pregnancy   Meds: No orders of the defined types were placed in this encounter.   Labs/procedures today: none  Treatment Plan:  schedule IOL @ 37 wks per MFM recommendations  Reviewed: Preterm labor symptoms and general obstetric precautions including but not limited to vaginal bleeding, contractions, leaking of fluid and fetal movement were reviewed in detail with the patient.  All questions were answered. Has home bp cuff. Check bp weekly, let us know if >140/90.   Follow-up: Return in about 5 weeks (around 02/25/2021) for Postpartum Visit.  Orders Placed This Encounter  Procedures   Culture, beta strep (group b only)   Raelyn Mora MSN, CNM 01/21/2021 2:10 PM

## 2021-01-22 ENCOUNTER — Encounter (HOSPITAL_COMMUNITY): Payer: Self-pay

## 2021-01-22 ENCOUNTER — Telehealth (HOSPITAL_COMMUNITY): Payer: Self-pay | Admitting: *Deleted

## 2021-01-22 ENCOUNTER — Encounter (HOSPITAL_COMMUNITY): Payer: Self-pay | Admitting: *Deleted

## 2021-01-22 LAB — CERVICOVAGINAL ANCILLARY ONLY
Bacterial Vaginitis (gardnerella): NEGATIVE
Candida Glabrata: NEGATIVE
Candida Vaginitis: NEGATIVE
Chlamydia: NEGATIVE
Comment: NEGATIVE
Comment: NEGATIVE
Comment: NEGATIVE
Comment: NEGATIVE
Comment: NEGATIVE
Comment: NORMAL
Neisseria Gonorrhea: NEGATIVE
Trichomonas: NEGATIVE

## 2021-01-22 NOTE — Telephone Encounter (Signed)
Preadmission screen  

## 2021-01-23 ENCOUNTER — Encounter: Payer: Self-pay | Admitting: Obstetrics and Gynecology

## 2021-01-24 ENCOUNTER — Other Ambulatory Visit: Payer: Self-pay | Admitting: Advanced Practice Midwife

## 2021-01-25 ENCOUNTER — Other Ambulatory Visit: Payer: Self-pay

## 2021-01-25 ENCOUNTER — Other Ambulatory Visit: Payer: Self-pay | Admitting: Family Medicine

## 2021-01-25 LAB — CULTURE, BETA STREP (GROUP B ONLY): Strep Gp B Culture: NEGATIVE

## 2021-01-26 ENCOUNTER — Other Ambulatory Visit: Payer: Self-pay

## 2021-01-26 ENCOUNTER — Encounter (HOSPITAL_COMMUNITY): Payer: Self-pay | Admitting: Family Medicine

## 2021-01-26 ENCOUNTER — Inpatient Hospital Stay (HOSPITAL_COMMUNITY): Payer: Medicaid Other

## 2021-01-26 ENCOUNTER — Inpatient Hospital Stay (HOSPITAL_COMMUNITY)
Admission: AD | Admit: 2021-01-26 | Discharge: 2021-01-28 | DRG: 805 | Disposition: A | Discharge status: Home / Self Care | Payer: Medicaid Other | Attending: Family Medicine | Admitting: Family Medicine

## 2021-01-26 DIAGNOSIS — D696 Thrombocytopenia, unspecified: Secondary | ICD-10-CM | POA: Diagnosis present

## 2021-01-26 DIAGNOSIS — Z3A37 37 weeks gestation of pregnancy: Secondary | ICD-10-CM | POA: Diagnosis not present

## 2021-01-26 DIAGNOSIS — O2662 Liver and biliary tract disorders in childbirth: Principal | ICD-10-CM | POA: Diagnosis present

## 2021-01-26 DIAGNOSIS — O26619 Liver and biliary tract disorders in pregnancy, unspecified trimester: Secondary | ICD-10-CM | POA: Diagnosis present

## 2021-01-26 DIAGNOSIS — D563 Thalassemia minor: Secondary | ICD-10-CM | POA: Diagnosis present

## 2021-01-26 DIAGNOSIS — O9912 Other diseases of the blood and blood-forming organs and certain disorders involving the immune mechanism complicating childbirth: Secondary | ICD-10-CM | POA: Diagnosis present

## 2021-01-26 DIAGNOSIS — D6959 Other secondary thrombocytopenia: Secondary | ICD-10-CM | POA: Diagnosis present

## 2021-01-26 DIAGNOSIS — Z20822 Contact with and (suspected) exposure to covid-19: Secondary | ICD-10-CM | POA: Diagnosis present

## 2021-01-26 DIAGNOSIS — K831 Obstruction of bile duct: Secondary | ICD-10-CM | POA: Diagnosis present

## 2021-01-26 DIAGNOSIS — O26649 Intrahepatic cholestasis of pregnancy, unspecified trimester: Secondary | ICD-10-CM | POA: Diagnosis present

## 2021-01-26 LAB — COMPREHENSIVE METABOLIC PANEL
ALT: 23 U/L (ref 0–44)
AST: 26 U/L (ref 15–41)
Albumin: 2.7 g/dL — ABNORMAL LOW (ref 3.5–5.0)
Alkaline Phosphatase: 90 U/L (ref 38–126)
Anion gap: 8 (ref 5–15)
BUN: 6 mg/dL (ref 6–20)
CO2: 19 mmol/L — ABNORMAL LOW (ref 22–32)
Calcium: 9.2 mg/dL (ref 8.9–10.3)
Chloride: 108 mmol/L (ref 98–111)
Creatinine, Ser: 0.5 mg/dL (ref 0.44–1.00)
GFR, Estimated: >60 mL/min (ref >60)
Glucose, Bld: 85 mg/dL (ref 70–99)
Potassium: 3.8 mmol/L (ref 3.5–5.1)
Sodium: 135 mmol/L (ref 135–145)
Total Bilirubin: 0.3 mg/dL (ref 0.3–1.2)
Total Protein: 6.2 g/dL — ABNORMAL LOW (ref 6.5–8.1)

## 2021-01-26 LAB — CBC
HCT: 33.4 % — ABNORMAL LOW (ref 36.0–46.0)
Hemoglobin: 10.9 g/dL — ABNORMAL LOW (ref 12.0–15.0)
MCH: 28.9 pg (ref 26.0–34.0)
MCHC: 32.6 g/dL (ref 30.0–36.0)
MCV: 88.6 fL (ref 80.0–100.0)
Platelets: 136 10*3/uL — ABNORMAL LOW (ref 150–400)
RBC: 3.77 MIL/uL — ABNORMAL LOW (ref 3.87–5.11)
RDW: 14.6 % (ref 11.5–15.5)
WBC: 6.4 10*3/uL (ref 4.0–10.5)
nRBC: 0 % (ref 0.0–0.2)

## 2021-01-26 LAB — TYPE AND SCREEN
ABO/RH(D): O POS
Antibody Screen: NEGATIVE

## 2021-01-26 LAB — SARS CORONAVIRUS 2 (TAT 6-24 HRS)
SARS Coronavirus 2: NEGATIVE
SARS Coronavirus 2: NEGATIVE

## 2021-01-26 LAB — RPR: RPR Ser Ql: NONREACTIVE

## 2021-01-26 MED ORDER — SOD CITRATE-CITRIC ACID 500-334 MG/5ML PO SOLN: 30.0000 mL | ORAL | Status: DC | PRN

## 2021-01-26 MED ORDER — MISOPROSTOL 50MCG HALF TABLET
50.0000 ug | ORAL_TABLET | ORAL | Status: DC | PRN
  Administered 2021-01-26 (×3): 50 ug via BUCCAL
  Filled 2021-01-26 (×2): qty 1

## 2021-01-26 MED ORDER — LACTATED RINGERS IV SOLN: INTRAVENOUS | Status: DC

## 2021-01-26 MED ORDER — OXYTOCIN-SODIUM CHLORIDE 30-0.9 UT/500ML-% IV SOLN
1.0000 m[IU]/min | INTRAVENOUS | Status: DC
  Administered 2021-01-26: 2 m[IU]/min via INTRAVENOUS

## 2021-01-26 MED ORDER — OXYCODONE-ACETAMINOPHEN 5-325 MG PO TABS: 1.0000 | ORAL_TABLET | ORAL | Status: DC | PRN

## 2021-01-26 MED ORDER — LIDOCAINE HCL (PF) 1 % IJ SOLN: 30.0000 mL | INTRAMUSCULAR | Status: DC | PRN

## 2021-01-26 MED ORDER — TERBUTALINE SULFATE 1 MG/ML IJ SOLN: 0.2500 mg | Freq: Once | INTRAMUSCULAR | Status: DC | PRN

## 2021-01-26 MED ORDER — ACETAMINOPHEN 325 MG PO TABS: 650.0000 mg | ORAL_TABLET | ORAL | Status: DC | PRN

## 2021-01-26 MED ORDER — FENTANYL CITRATE (PF) 100 MCG/2ML IJ SOLN
50.0000 ug | INTRAMUSCULAR | Status: DC | PRN
  Administered 2021-01-27: 100 ug via INTRAVENOUS
  Filled 2021-01-26: qty 2

## 2021-01-26 MED ORDER — LACTATED RINGERS IV SOLN: 500.0000 mL | INTRAVENOUS | Status: DC | PRN

## 2021-01-26 MED ORDER — MISOPROSTOL 50MCG HALF TABLET
ORAL_TABLET | ORAL | Status: AC
  Filled 2021-01-26: qty 1

## 2021-01-26 MED ORDER — OXYTOCIN-SODIUM CHLORIDE 30-0.9 UT/500ML-% IV SOLN: 1.0000 m[IU]/min | INTRAVENOUS | Status: DC

## 2021-01-26 MED ORDER — OXYTOCIN-SODIUM CHLORIDE 30-0.9 UT/500ML-% IV SOLN
2.5000 [IU]/h | INTRAVENOUS | Status: DC
  Administered 2021-01-27: 2.5 [IU]/h via INTRAVENOUS
  Filled 2021-01-26: qty 500

## 2021-01-26 MED ORDER — ONDANSETRON HCL 4 MG/2ML IJ SOLN: 4.0000 mg | Freq: Four times a day (QID) | INTRAMUSCULAR | Status: DC | PRN

## 2021-01-26 MED ORDER — OXYTOCIN BOLUS FROM INFUSION
333.0000 mL | Freq: Once | INTRAVENOUS | Status: AC
  Administered 2021-01-27: 333 mL via INTRAVENOUS

## 2021-01-26 MED ORDER — ZOLPIDEM TARTRATE 5 MG PO TABS: 5.0000 mg | ORAL_TABLET | Freq: Every evening | ORAL | Status: DC | PRN

## 2021-01-26 MED ORDER — OXYCODONE-ACETAMINOPHEN 5-325 MG PO TABS
2.0000 | ORAL_TABLET | ORAL | Status: DC | PRN
Start: 2021-01-26 — End: 2021-01-27

## 2021-01-26 NOTE — H&P (Addendum)
OBSTETRIC ADMISSION HISTORY AND PHYSICAL  Dorothy Olsen is a 30 y.o. female G2P1001 with IUP at [redacted]w[redacted]d by LMP presenting for IOL due to cholestasis of pregnancy. She reports +FMs, no LOF, no VB, no blurry vision, headaches, peripheral edema, or RUQ pain.  She plans on breast feeding. She does not request any birth control.  She received her prenatal care at  [facility].    Dating: By LMP --->  Estimated Date of Delivery: 02/16/21  Sono:   @[redacted]w[redacted]d , CWD, normal anatomy, cephalic presentation, posterior placenta lie, 2898g, 56% EFW  Prenatal History/Complications:  Cholestasis of Pregnancy  Past Medical History: Past Medical History:  Diagnosis Date   Cholestasis    Gestational thrombocytopenia (HCC)     Past Surgical History: Past Surgical History:  Procedure Laterality Date   WISDOM TOOTH EXTRACTION      Obstetrical History: OB History     Gravida  2   Para  1   Term  1   Preterm      AB      Living  1      SAB      IAB      Ectopic      Multiple      Live Births  1           Social History Social History   Socioeconomic History   Marital status: Married    Spouse name: Not on file   Number of children: 1   Years of education: college   Highest education level: Bachelor's degree (e.g., BA, AB, BS)  Occupational History    Employer: [employer]  Tobacco Use   Smoking status: Never   Smokeless tobacco: Never  Vaping Use   Vaping Use: Never used  Substance and Sexual Activity   Alcohol use: Not Currently   Drug use: Not Currently   Sexual activity: Yes    Birth control/protection: None  Other Topics Concern   Not on file  Social History Narrative   Not on file   Social Determinants of Health   Financial Resource Strain: Not on file  Food Insecurity: Not on file  Transportation Needs: Not on file  Physical Activity: Not on file  Stress: Not on file  Social Connections: Not on file    Family History: Family History   Problem Relation Age of Onset   Diabetes Mother    Diabetes Paternal Grandmother    Diabetes Paternal Grandfather     Allergies: Allergies  Allergen Reactions   Nickel Rash    Medications Prior to Admission  Medication Sig Dispense Refill Last Dose   Prenatal Vit-Fe Fumarate-FA (PRENATAL VITAMINS) 28-0.8 MG TABS Take 1 tablet by mouth daily.      ursodiol (ACTIGALL) 500 MG tablet Take 1 tablet (500 mg total) by mouth 2 (two) times daily. 60 tablet 9      Review of Systems  All systems reviewed and negative except as stated in HPI  Blood pressure 114/74, pulse 82, temperature 98.8 F (37.1 C), temperature source Axillary, resp. rate 17, height 5\' 6"  (1.676 m), weight 94.8 kg, last menstrual period 05/12/2020, currently breastfeeding.  General appearance: alert, cooperative, and no distress Lungs: normal work of breathing on room air Heart: normal rate Abdomen: gravid Extremities: Homans sign is negative, no sign of DVT Presentation: cephalic Fetal monitoring: Baseline: 135 bpm, Variability: Good {> 6 bpm), Accelerations: Reactive, and Decelerations: Absent Uterine activity: Patient denies feeling any contractions, irregular on monitor Dilation: 1 Effacement (%):  Thick Station: -3 Exam by:: E Chipps RN   Prenatal labs: ABO, Rh: --/--/O POS (08/16 0040) Antibody: NEG (08/16 0040) Rubella: 2.79 (02/16 1545) RPR: Non Reactive (06/16 0828)  HBsAg: Negative (02/16 1545)  HIV: Non Reactive (06/16 0828)  GBS: Negative/-- (08/11 1406)  2 hr Glucola Normal (84) Genetic screening  NIPS: low risk female, Neg AFP Anatomy US normal  Prenatal Transfer Tool  Maternal Diabetes: No Genetic Screening: Normal Maternal Ultrasounds/Referrals: Normal Fetal Ultrasounds or other Referrals:  Referred to Materal Fetal Medicine  Maternal Substance Abuse:  No Significant Maternal Medications:  Meds include: Other: Ursodiol Significant Maternal Lab Results: Group B Strep  negative  Results for orders placed or performed during the hospital encounter of 01/26/21 (from the past 24 hour(s))  Type and screen [facility]   Collection Time: 01/26/21 12:40 AM  Result Value Ref Range   ABO/RH(D) O POS    Antibody Screen NEG    Sample Expiration      01/29/2021,2359 Performed at [facility], [address] [place], [place] [address]   CBC   Collection Time: 01/26/21 12:42 AM  Result Value Ref Range   WBC 6.4 4.0 - 10.5 K/uL   RBC 3.77 (L) 3.87 - 5.11 MIL/uL   Hemoglobin 10.9 (L) 12.0 - 15.0 g/dL   HCT 43.3 (L) 29.5 - 18.8 %   MCV 88.6 80.0 - 100.0 fL   MCH 28.9 26.0 - 34.0 pg   MCHC 32.6 30.0 - 36.0 g/dL   RDW 41.6 60.6 - 30.1 %   Platelets 136 (L) 150 - 400 K/uL   nRBC 0.0 0.0 - 0.2 %  Comprehensive metabolic panel   Collection Time: 01/26/21 12:42 AM  Result Value Ref Range   Sodium 135 135 - 145 mmol/L   Potassium 3.8 3.5 - 5.1 mmol/L   Chloride 108 98 - 111 mmol/L   CO2 19 (L) 22 - 32 mmol/L   Glucose, Bld 85 70 - 99 mg/dL   BUN 6 6 - 20 mg/dL   Creatinine, Ser 6.01 0.44 - 1.00 mg/dL   Calcium 9.2 8.9 - 09.3 mg/dL   Total Protein 6.2 (L) 6.5 - 8.1 g/dL   Albumin 2.7 (L) 3.5 - 5.0 g/dL   AST 26 15 - 41 U/L   ALT 23 0 - 44 U/L   Alkaline Phosphatase 90 38 - 126 U/L   Total Bilirubin 0.3 0.3 - 1.2 mg/dL   GFR, Estimated >23 >55 mL/min   Anion gap 8 5 - 15    Patient Active Problem List   Diagnosis Date Noted   Indication for care in labor or delivery 01/26/2021   Cholestasis of pregnancy, antepartum 07/31/2020   Supervision of other normal pregnancy, antepartum 06/30/2020    Assessment/Plan:  Dorothy Olsen is a 30 y.o. G2P1001 at [redacted]w[redacted]d here for IOL due to cholestasis of pregnancy.  #Labor:  Will start induction with buccal Cytotec 50 mcg.  Patient does not want foley balloon.  Will reassess in 4 hours. #Pain: Epidural PRN #FWB: Cat 1 #ID:  GBS negative #MOF: Breast feeding #MOC: Declines  contraception #Circ:  N/A baby girl #Gestational Thrombocytopenia:  Platelets 136 on admission.  Will continue to monitor.  Jovita Kussmaul, MD  01/26/2021, 2:08 AM    GME ATTESTATION:  I saw and evaluated the patient. I agree with the findings and the plan of care as documented in the resident's note. I have made changes to documentation as necessary.  Trula Ore  Mathis Fare, MD OB Fellow, Faculty [facility], Center for [facility] 01/26/2021 3:27 AM

## 2021-01-26 NOTE — Progress Notes (Signed)
Labor Progress Note Dorothy Olsen is a 30 y.o. G2P1001 at [redacted]w[redacted]d presented for IOL due to cholestasis of pregnancy. S: Patient was sitting upright in bed. She says she is starting to feel the contractions but is still comfortable.  O:  BP 131/77   Pulse 89   Temp 97.7 F (36.5 C) (Oral)   Resp 18   Ht 5\' 6"  (1.676 m)   Wt 94.8 kg   LMP 05/12/2020 (Approximate)   BMI 33.73 kg/m  EFM: 140 bpm/moderate variability/accels +, decels -  CVE: Dilation: 3 Effacement (%): 70 Cervical Position: Middle Station: -2 Presentation: Vertex Exam by:: Dr. [email or web address]   A&P: 30 y.o. G2P1001 [redacted]w[redacted]d here for cholestasis of pregnancy. #Labor: Progressing well. Patient was started on pitocin @1630 . Starting to feel contractions, due for check. #Pain: Patient prefers natural labor, still considering epidural. #FWB: Cat 1 #GBS negative   Treavor Blomquist J Rodriguez-Teodoro, Medical Student 10:06 PM

## 2021-01-26 NOTE — Progress Notes (Signed)
Labor Progress Note Dorothy Olsen is a 30 y.o. G2P1001 at [redacted]w[redacted]d presented for IOL due to cholestasis of pregnancy. S: Indicates feeling some pressure in her back.  Still feeling comfortable.  O:  BP 120/81   Pulse 90   Temp 98.1 F (36.7 C) (Oral)   Resp 17   Ht 5\' 6"  (1.676 m)   Wt 94.8 kg   LMP 05/12/2020 (Approximate)   BMI 33.73 kg/m  EFM: 140/Moderate/ + Accels, + Variable Deccels  CVE: Dilation: 2 Effacement (%): 50 Cervical Position: Middle Station: -2 Presentation: Vertex Exam by:: E Chipps RN   A&P: 30 y.o. G2P1001 [redacted]w[redacted]d presented for IOL due to cholestasis of pregnancy. #Labor: Progressing well.  Will give repeat dose of buccal Cytotec. #Pain: Epidural  #FWB: Cat 2 #GBS negative #Gestational Thrombocytopenia:  Platelets 136 on admission.  Will continue to monitor.  [redacted]w[redacted]d, MD 6:16 AM

## 2021-01-26 NOTE — Progress Notes (Signed)
Labor Progress Note Dorothy Olsen is a 30 y.o. G2P1001 at [redacted]w[redacted]d presented for IOL 2/2 cholestasis.   S: Patient comfortable in room, feeling "cramping" but not uncomfortable strong contractions. Denies LOF, VB.  O:  BP 117/67   Pulse 87   Temp 97.8 F (36.6 C)   Resp 18   Ht 5\' 6"  (1.676 m)   Wt 94.8 kg   LMP 05/12/2020 (Approximate)   BMI 33.73 kg/m  EFM: 130bpm/mod var/+accels, no decels TOCO: couplet contractions q2-65min  CVE: Dilation: 3 Effacement (%): 70 Cervical Position: Middle Station: -2 Presentation: Vertex Exam by:: Dr. [email or web address]   A&P: 30 y.o. G2P1001 [redacted]w[redacted]d here for IOL for cholestasis.  #Labor: Latent labor progressing well. Switching from cytotec to pitocin. #Pain: Patient prefers natural labor, will consider epidural #FWB: Cat I #GBS negative  [redacted]w[redacted]d, DO 4:29 PM

## 2021-01-27 ENCOUNTER — Encounter (HOSPITAL_COMMUNITY): Payer: Self-pay | Admitting: Family Medicine

## 2021-01-27 DIAGNOSIS — O2662 Liver and biliary tract disorders in childbirth: Secondary | ICD-10-CM

## 2021-01-27 DIAGNOSIS — D563 Thalassemia minor: Secondary | ICD-10-CM | POA: Diagnosis present

## 2021-01-27 DIAGNOSIS — O9912 Other diseases of the blood and blood-forming organs and certain disorders involving the immune mechanism complicating childbirth: Secondary | ICD-10-CM

## 2021-01-27 DIAGNOSIS — Z3A37 37 weeks gestation of pregnancy: Secondary | ICD-10-CM

## 2021-01-27 LAB — BILE ACIDS, TOTAL: Bile Acids Total: 25.4 umol/L — ABNORMAL HIGH (ref 0.0–10.0)

## 2021-01-27 MED ORDER — DIPHENHYDRAMINE HCL 50 MG/ML IJ SOLN: 12.5000 mg | INTRAMUSCULAR | Status: DC | PRN

## 2021-01-27 MED ORDER — SIMETHICONE 80 MG PO CHEW: 80.0000 mg | CHEWABLE_TABLET | ORAL | Status: DC | PRN

## 2021-01-27 MED ORDER — EPHEDRINE 5 MG/ML INJ: 10.0000 mg | INTRAVENOUS | Status: DC | PRN

## 2021-01-27 MED ORDER — IBUPROFEN 600 MG PO TABS
600.0000 mg | ORAL_TABLET | Freq: Four times a day (QID) | ORAL | Status: DC
  Administered 2021-01-27 – 2021-01-28 (×4): 600 mg via ORAL
  Filled 2021-01-27 (×4): qty 1

## 2021-01-27 MED ORDER — ZOLPIDEM TARTRATE 5 MG PO TABS: 5.0000 mg | ORAL_TABLET | Freq: Every evening | ORAL | Status: DC | PRN

## 2021-01-27 MED ORDER — DIBUCAINE (PERIANAL) 1 % EX OINT: 1.0000 "application " | TOPICAL_OINTMENT | CUTANEOUS | Status: DC | PRN

## 2021-01-27 MED ORDER — WITCH HAZEL-GLYCERIN EX PADS: 1.0000 "application " | MEDICATED_PAD | CUTANEOUS | Status: DC | PRN

## 2021-01-27 MED ORDER — ACETAMINOPHEN 325 MG PO TABS: 650.0000 mg | ORAL_TABLET | ORAL | Status: DC | PRN

## 2021-01-27 MED ORDER — LACTATED RINGERS IV SOLN: 500.0000 mL | Freq: Once | INTRAVENOUS | Status: DC

## 2021-01-27 MED ORDER — PHENYLEPHRINE 40 MCG/ML (10ML) SYRINGE FOR IV PUSH (FOR BLOOD PRESSURE SUPPORT): 80.0000 ug | PREFILLED_SYRINGE | INTRAVENOUS | Status: DC | PRN

## 2021-01-27 MED ORDER — ONDANSETRON HCL 4 MG PO TABS: 4.0000 mg | ORAL_TABLET | ORAL | Status: DC | PRN

## 2021-01-27 MED ORDER — BENZOCAINE-MENTHOL 20-0.5 % EX AERO: 1.0000 "application " | INHALATION_SPRAY | CUTANEOUS | Status: DC | PRN

## 2021-01-27 MED ORDER — FENTANYL-BUPIVACAINE-NACL 0.5-0.125-0.9 MG/250ML-% EP SOLN: 12.0000 mL/h | EPIDURAL | Status: DC | PRN

## 2021-01-27 MED ORDER — PRENATAL MULTIVITAMIN CH
1.0000 | ORAL_TABLET | Freq: Every day | ORAL | Status: DC
  Administered 2021-01-28: 1 via ORAL
  Filled 2021-01-27: qty 1

## 2021-01-27 MED ORDER — ONDANSETRON HCL 4 MG/2ML IJ SOLN: 4.0000 mg | INTRAMUSCULAR | Status: DC | PRN

## 2021-01-27 MED ORDER — COCONUT OIL OIL: 1.0000 "application " | TOPICAL_OIL | Status: DC | PRN

## 2021-01-27 MED ORDER — DIPHENHYDRAMINE HCL 25 MG PO CAPS: 25.0000 mg | ORAL_CAPSULE | Freq: Four times a day (QID) | ORAL | Status: DC | PRN

## 2021-01-27 MED ORDER — SENNOSIDES-DOCUSATE SODIUM 8.6-50 MG PO TABS
2.0000 | ORAL_TABLET | ORAL | Status: DC
  Administered 2021-01-27: 2 via ORAL
  Filled 2021-01-27: qty 2

## 2021-01-27 MED ORDER — TETANUS-DIPHTH-ACELL PERTUSSIS 5-2.5-18.5 LF-MCG/0.5 IM SUSY: 0.5000 mL | PREFILLED_SYRINGE | Freq: Once | INTRAMUSCULAR | Status: DC

## 2021-01-27 NOTE — Lactation Note (Signed)
This note was copied from a baby's chart. Lactation Consultation Note  Patient Name: Dorothy Olsen TIRWE'R Date: 01/27/2021 Reason for consult: L&D Initial assessment;Early term 37-38.6wks;Other (Comment) Age: 31 mins . LC L/D visit at 50 mins PP / baby STS with mom/ rooting . LC offered to assist to latch and mom receptive. Baby opened mouth to latch left breast and after 4-5 sucks, lost her depth and would come off. Latch 7, Re-latched several times. Attempted to latch on the right breast only a few sucks/ and off LC reassured mom the baby made a good effort to latch.  Mom is an experienced BF of 20 months with her 1st baby.  Maternal Data Has patient been taught Hand Expression?: No Does the patient have breastfeeding experience prior to this delivery?: Yes How long did the patient breastfeed?: per mom 20 months  Feeding Mother's Current Feeding Choice: Breast Milk  LATCH Score Latch: Repeated attempts needed to sustain latch, nipple held in mouth throughout feeding, stimulation needed to elicit sucking reflex.  Audible Swallowing: A few with stimulation  Type of Nipple: Everted at rest and after stimulation  Comfort (Breast/Nipple): Soft / non-tender  Hold (Positioning): Assistance needed to correctly position infant at breast and maintain latch.  LATCH Score: 7   Lactation Tools Discussed/Used    Interventions Interventions: Breast feeding basics reviewed;Assisted with latch;Skin to skin;Breast massage;Hand express;Breast compression;Adjust position;Education  Discharge    Consult Status Consult Status: Follow-up from L&D Date: 01/27/21 Follow-up type: In-patient    Matilde Sprang Ginny Loomer 01/27/2021, 1:40 PM

## 2021-01-27 NOTE — Progress Notes (Signed)
Dorothy Olsen is a 30 y.o. G2P1001 at [redacted]w[redacted]d by LMP admitted for induction of labor due to cholestasis of pregnancy.  Subjective: Pt breathing with contractions, relaxing in between.  Family in room for support.   Objective: BP 128/70   Pulse (!) 106   Temp 97.8 F (36.6 C) (Axillary)   Resp 16   Ht 5\' 6"  (1.676 m)   Wt 94.8 kg   LMP 05/12/2020 (Approximate)   BMI 33.73 kg/m  No intake/output data recorded. No intake/output data recorded.  FHT:  FHR: 135 bpm, variability: moderate,  accelerations:  Present,  decelerations:  Absent UC:   regular, every 2 minutes SVE:   Dilation: 4.5 Effacement (%): 70 Station: -2 Exam by:: [email or web address] CNM  Labs: Lab Results  Component Value Date   WBC 6.4 01/26/2021   HGB 10.9 (L) 01/26/2021   HCT 33.4 (L) 01/26/2021   MCV 88.6 01/26/2021   PLT 136 (L) 01/26/2021    Assessment / Plan: Induction of labor due to cholestasis of pregnancy,  progressing well on pitocin  Labor: Progressing normally. Discussed options and pt prefers to delay AROM and continue to titrate Pitocin at this time. She is coping well with labor without medications.  Preeclampsia:   n/a Fetal Wellbeing:  Category I Pain Control:  Labor support without medications I/D:   GBS negative Anticipated MOD:  NSVD  01/28/2021 01/27/2021, 12:24 AM

## 2021-01-27 NOTE — Progress Notes (Signed)
Dorothy Olsen is a 30 y.o. G2P1001 at [redacted]w[redacted]d by LMP admitted for induction of labor due to cholestasis of pregnancy.  Subjective: Patient breathing well through ctx, up and walking.  Objective: BP 115/75   Pulse 80   Temp 98 F (36.7 C) (Axillary)   Resp 16   Ht 5\' 6"  (1.676 m)   Wt 94.8 kg   LMP 05/12/2020 (Approximate)   BMI 33.73 kg/m  No intake/output data recorded. No intake/output data recorded.  FHT:  FHR: 135 bpm, variability: moderate,  accelerations:  Present,  decelerations:  Absent UC:   regular, every 2-3 minutes SVE:   Dilation: 5 Effacement (%): 60 Station: -1 Exam by:: Dr. [email or web address]  Labs: Lab Results  Component Value Date   WBC 6.4 01/26/2021   HGB 10.9 (L) 01/26/2021   HCT 33.4 (L) 01/26/2021   MCV 88.6 01/26/2021   PLT 136 (L) 01/26/2021    Assessment / Plan: Induction of labor due to cholestasis of pregnancy, on pitocin.  Labor:  Patient has had same cervical exam for approx 5 hours. She preferred not to have AROM at last check. Discussed with her and recommend AROM, patient will consider it. Continue titrating pitocin. Preeclampsia:   n/a Fetal Wellbeing:  Category I Pain Control:  Labor support without medications I/D:   GBS neg Anticipated MOD:  NSVD  01/28/2021 01/27/2021, 9:54 AM

## 2021-01-27 NOTE — Lactation Note (Addendum)
This note was copied from a baby's chart. Lactation Consultation Note  Patient Name: Dorothy Olsen [id number] Date: 01/27/2021 Reason for consult: Initial assessment;Difficult latch;Early term 37-38.6wks Age:30 years  Infant struggling with latch. Mom large nipples and infant holding tongue back and dimpling with cheeks noted on each attempt despite trying different positions. Infant fed via spoon EBM from hand expression.   Mom requested NS with 24 better fit. It did help infant to open mouth wider but still with 24 NS infant not able to get a deep enough latch.  Breastfeeding supplementation guide provided.  Mom declined use of DBM or formula and would like to pump to see how much she can offer of her EBM first. Mom to give colostrum via spoon or cup feeder, progress to use of slow flow nipple if still unable to get infant to latch.   Plan 1.To feed based on cues 8-12x in 24 hr. Mom to work on pulling chin down in midst of latch to get infant deeper in prone position. 2. If unable to latch, Mom to offer EBM via spoon 3. DEBP q 3 hrs for 15 min  4. I and O sheet reviewed.  5 LC brochure of inpatient and outpatient services reviewed.  All questions answered at the end of the visit.   Maternal Data Has patient been taught Hand Expression?: Yes  Feeding Mother's Current Feeding Choice: Breast Milk  LATCH Score   Lactation Tools Discussed/Used   Interventions Interventions: Breast feeding basics reviewed;Breast compression;Assisted with latch;Adjust position;Skin to skin;Support pillows;DEBP;Breast massage;Position options;Hand express;Education;Expressed milk  Discharge Pump: Personal WIC Program: No  Consult Status Consult Status: Follow-up Date: 01/28/21 Follow-up type: In-patient    Shalyn Koral  Nicholson-Springer 01/27/2021, 7:45 PM

## 2021-01-27 NOTE — Discharge Summary (Addendum)
Postpartum Discharge Summary     Patient Name: Dorothy Olsen DOB: [birth year] MRN: [id number]  Date of admission: 01/26/2021 Delivery date:01/27/2021  Delivering provider: Serita Grammes D  Date of discharge: 01/28/2021  Admitting diagnosis: Indication for care in labor or delivery [O75.9] Intrauterine pregnancy: [redacted]w[redacted]d    Secondary diagnosis:  Active Problems:   Cholestasis of pregnancy, antepartum   [place]   Indication for care in labor or delivery   Alpha thalassemia silent carrier  Additional problems: none    Discharge diagnosis: Term Pregnancy Delivered                                              Post partum procedures: none Augmentation: AROM, Pitocin, and Cytotec Complications: None  Hospital course: Induction of Labor With Vaginal Delivery   30y.o. yo G2P1001 at 356w1das admitted to the hospital 01/26/2021 for induction of labor.  Indication for induction: Cholestasis of pregnancy (diagnosed at 11wks). Of note, she also has gest thrombocytopenia with platelets stable at 136K on admission. Patient had a labor course remarkable for having cytotec for cervical ripening followed by Pit, then eventually AROM prior to progressing into active labor.  Membrane Rupture Time/Date: 10:42 AM ,01/27/2021   Delivery Method:Vaginal, Spontaneous  Episiotomy: None  Lacerations:  None  Details of delivery can be found in separate delivery note.  Patient had a routine postpartum course. Plts remained stable at 124K on PPD#1. Patient is discharged home 01/28/21 per her request for early d/c.  Newborn Data: Birth date:01/27/2021  Birth time:12:01 PM  Gender:Female  Living status:Living  Apgars:9 ,10  Weight:2785 g (6lb 2.2oz)  Magnesium Sulfate received: No BMZ received: No Rhophylac:N/A MMR:N/A T-DaP: offered postpartum Flu: N/A Transfusion:No  Physical exam  Vitals:   01/27/21 1815 01/27/21 2216 01/28/21 0201 01/28/21 0535  BP: (!) 120/57 127/66  134/73 (!) 108/54  Pulse: 78 79 72 83  Resp: _0 Temp: 97.9 F (36.6 C) 98.1 F (36.7 C) 98.3 F (36.8 C) 98 F (36.7 C)  TempSrc: Oral Oral Oral Oral  SpO2: 100% 100% 100% 100%  Weight:      Height:       General: alert, cooperative, and no distress Lochia: appropriate Uterine Fundus: firm Incision: N/A DVT Evaluation: No evidence of DVT seen on physical exam. Labs: Lab Results  Component Value Date   WBC 8.1 01/28/2021   HGB 9.8 (L) 01/28/2021   HCT 30.2 (L) 01/28/2021   MCV 88.6 01/28/2021   PLT 124 (L) 01/28/2021   CMP Latest Ref Rng & Units 01/26/2021  Glucose 70 - 99 mg/dL 85  BUN 6 - 20 mg/dL 6  Creatinine 0.44 - 1.00 mg/dL 0.50  Sodium 135 - 145 mmol/L 135  Potassium 3.5 - 5.1 mmol/L 3.8  Chloride 98 - 111 mmol/L 108  CO2 22 - 32 mmol/L 19(L)  Calcium 8.9 - 10.3 mg/dL 9.2  Total Protein 6.5 - 8.1 g/dL 6.2(L)  Total Bilirubin 0.3 - 1.2 mg/dL 0.3  Alkaline Phos 38 - 126 U/L 90  AST 15 - 41 U/L 26  ALT 0 - 44 U/L 23   Edinburgh Score: Edinburgh Postnatal Depression Scale Screening Tool 02/28/2019  I have been able to laugh and see the funny side of things. 0  I have looked forward with enjoyment to things. 0  I have blamed myself  unnecessarily when things went wrong. 2  I have been anxious or worried for no good reason. 0  I have felt scared or panicky for no good reason. 1  Things have been getting on top of me. 1  I have been so unhappy that I have had difficulty sleeping. 0  I have felt sad or miserable. 0  I have been so unhappy that I have been crying. 0  The thought of harming myself has occurred to me. 0  Edinburgh Postnatal Depression Scale Total 4     After visit meds:  Allergies as of 01/28/2021       Reactions   Nickel Rash        Medication List     STOP taking these medications    ursodiol 500 MG tablet Commonly known as: ACTIGALL       TAKE these medications    acetaminophen 325 MG tablet Commonly known as:  Tylenol Take 2 tablets (650 mg total) by mouth every 4 (four) hours as needed (for pain scale < 4).   coconut oil Oil Apply 1 application topically as needed.   ibuprofen 600 MG tablet Commonly known as: ADVIL Take 1 tablet (600 mg total) by mouth every 6 (six) hours.   Prenatal Vitamins 28-0.8 MG Tabs Take 1 tablet by mouth daily.   simethicone 80 MG chewable tablet Commonly known as: MYLICON Chew 1 tablet (80 mg total) by mouth as needed for flatulence.         Discharge home in stable condition Infant Feeding: Breast Infant Disposition:home with mother Discharge instruction: per After Visit Summary and Postpartum booklet. Activity: Advance as tolerated. Pelvic rest for 6 weeks.  Diet: routine diet Future Appointments: Future Appointments  Date Time Provider [place]  03/11/2021  2:00 PM Laury Deep, CNM [facility] None   Follow up Visit:  Myrtis Ser, CNM  P [facility] Admin Please schedule this patient for Postpartum visit in: 4 weeks with the following provider: Any provider  In-Person  For C/S patients schedule nurse incision check in weeks 2 weeks: no  High risk pregnancy complicated by: cholestasis; gest thrombocytopenia  Delivery mode:  SVD  Anticipated Birth Control:  none  PP Procedures needed: none  Schedule Integrated BH visit: no    01/28/2021 Delora Fuel, MD  CNM attestation I have seen and examined this patient and agree with above documentation in the resident's note.   Dorothy Olsen is a 30 y.o. A2Q3335 s/p vag del.   Pain is well controlled.  Plan for birth control is no method.  Method of Feeding: breast  PE:  BP (!) 108/54 (BP Location: Left Arm)   Pulse 83   Temp 98 F (36.7 C) (Oral)   Resp 18   Ht _0  (1.676 m)   Wt 94.8 kg   LMP 05/12/2020 (Approximate)   SpO2 100%   Breastfeeding Unknown   BMI 33.73 kg/m  Fundus firm  Recent Labs    01/26/21 0042 01/28/21 0528  HGB 10.9* 9.8*  HCT 33.4* 30.2*      Plan: discharge today - postpartum care discussed - f/u clinic in 4 weeks for postpartum visit   Myrtis Ser, CNM 9:55 AM 01/28/2021

## 2021-01-27 NOTE — Progress Notes (Signed)
Patient decided to get AROM, performed now with clear fluid. Fetal head well applied, pt tolerated well.  Shirlean Mylar, MD [facility], PGY-3

## 2021-01-28 LAB — CBC
HCT: 30.2 % — ABNORMAL LOW (ref 36.0–46.0)
Hemoglobin: 9.8 g/dL — ABNORMAL LOW (ref 12.0–15.0)
MCH: 28.7 pg (ref 26.0–34.0)
MCHC: 32.5 g/dL (ref 30.0–36.0)
MCV: 88.6 fL (ref 80.0–100.0)
Platelets: 124 10*3/uL — ABNORMAL LOW (ref 150–400)
RBC: 3.41 MIL/uL — ABNORMAL LOW (ref 3.87–5.11)
RDW: 14.8 % (ref 11.5–15.5)
WBC: 8.1 10*3/uL (ref 4.0–10.5)
nRBC: 0 % (ref 0.0–0.2)

## 2021-01-28 MED ORDER — COCONUT OIL OIL: 1.0000 "application " | TOPICAL_OIL | 0 refills | Status: AC | PRN

## 2021-01-28 MED ORDER — SIMETHICONE 80 MG PO CHEW: 80.0000 mg | CHEWABLE_TABLET | ORAL | 0 refills | Status: AC | PRN

## 2021-01-28 MED ORDER — ACETAMINOPHEN 325 MG PO TABS: 650.0000 mg | ORAL_TABLET | ORAL | Status: AC | PRN

## 2021-01-28 MED ORDER — IBUPROFEN 600 MG PO TABS: 600.0000 mg | ORAL_TABLET | Freq: Four times a day (QID) | ORAL | 0 refills | Status: AC

## 2021-01-28 NOTE — Lactation Note (Signed)
This note was copied from a baby's chart. Lactation Consultation Note  Patient Name: Dorothy Olsen [id number] Date: 01/28/2021 Reason for consult: Follow-up assessment;Early term 37-38.6wks Age:31 hours  P2, [redacted]w[redacted]d.  [name].  Discussed feeding plan. Plan: Breastfeed on demand. Pump q 3hours and give volume back to baby. If baby is unable to sustain latch at home, mother will supplement with her breastmilk and formula if needed. Reviewed engorgement care and monitoring voids/stools. Feed on demand with cues.  Goal 8-12+ times per day after first 24 hrs.  Place baby STS if not cueing.    Maternal Data Has patient been taught Hand Expression?: Yes  Feeding Mother's Current Feeding Choice: Breast Milk  LATCH Score Latch: Grasps breast easily, tongue down, lips flanged, rhythmical sucking.  Audible Swallowing: A few with stimulation  Type of Nipple: Everted at rest and after stimulation  Comfort (Breast/Nipple): Soft / non-tender    Lactation Tools Discussed/Used  DEBP at home.  Interventions Interventions: Breast feeding basics reviewed;Assisted with latch;Hand express;DEBP;Education  Discharge  Reviewed engorgement care and monitoring voids/stools.   Consult Status Consult Status: Follow-up Date: 01/28/21 Follow-up type: In-patient    Dorothy Olsen [facility] 01/28/2021, 12:41 PM

## 2021-01-28 NOTE — Lactation Note (Addendum)
This note was copied from a baby's chart. Lactation Consultation Note RN called LC d/t baby fussy and will not stay on the breast yet is hungry. Mom has Large everted nipples that the baby can latch onto but pops off after suckling a few times then wants back on repeat.  Mom is pumping but not able to collect anything to give other than a few drops. Mom did hand express some colostrum and give it in a spoon but then tried later and unable to get anything.  Discussed importance of pumping and baby needing to be supplemented.  Discussed w/mom pumping and bottle feeding until baby grows into nipples some. Maybe that is why she is constantly popping off is because the nipple is to large for her. Mom had the same issue with her 1st child. Mom chooses to cont. To BF not supplement other than her BM. Discussed the importance of I&O to monitor hydration to see if baby is transferring.    Patient Name: Dorothy Olsen Date: 01/28/2021 Reason for consult: Mother's request;Early term 37-38.6wks;Infant < 6lbs Age:48 hours  Maternal Data Has patient been taught Hand Expression?: Yes  Feeding    LATCH Score Latch: Grasps breast easily, tongue down, lips flanged, rhythmical sucking.  Audible Swallowing: None  Type of Nipple: Everted at rest and after stimulation  Comfort (Breast/Nipple): Soft / non-tender  Hold (Positioning): Assistance needed to correctly position infant at breast and maintain latch.  LATCH Score: 7   Lactation Tools Discussed/Used    Interventions Interventions: Breast feeding basics reviewed;Support pillows;Assisted with latch;Position options;Skin to skin;Breast massage;Hand express;DEBP;Breast compression;Adjust position  Discharge    Consult Status Consult Status: Follow-up Date: 01/28/21 Follow-up type: In-patient    Dorothy Olsen, Diamond Nickel 01/28/2021, 6:00 AM

## 2021-02-04 ENCOUNTER — Encounter: Payer: Medicaid Other | Admitting: Obstetrics and Gynecology

## 2021-02-06 ENCOUNTER — Telehealth (HOSPITAL_COMMUNITY): Payer: Self-pay

## 2021-02-06 NOTE — Telephone Encounter (Signed)
  No answer. Left message to return nurse call.  Marcelino Duster [facility] 02/06/2021,1552

## 2021-03-11 ENCOUNTER — Encounter: Payer: Self-pay | Admitting: Obstetrics and Gynecology

## 2021-03-11 ENCOUNTER — Telehealth: Payer: Medicaid Other | Admitting: Obstetrics and Gynecology

## 2021-03-11 NOTE — Progress Notes (Deleted)
    Post Partum Visit Note  Dorothy Olsen is a 30 y.o. G24P2002 female who presents for a postpartum visit. She is 6 week postpartum following a normal spontaneous vaginal delivery.  I have fully reviewed the prenatal and intrapartum course. The delivery was at 37 gestational weeks.  Anesthesia: none. Postpartum course has been uncomplicated. Baby is doing well. Baby is feeding by breast. Bleeding no bleeding. Bowel function is normal. Bladder function is normal. Patient is not sexually active. Contraception method is condoms. Postpartum depression screening: negative.   The pregnancy intention screening data noted above was reviewed. Potential methods of contraception were discussed. The patient elected to proceed with condoms.   Health Maintenance Due  Topic Date Due   COVID-19 Vaccine (1) Never done   INFLUENZA VACCINE  01/11/2021    The following portions of the patient's history were reviewed and updated as appropriate: allergies, current medications, past family history, past medical history, past social history, past surgical history, and problem list.  Review of Systems Constitutional: negative Eyes: negative Ears, nose, mouth, throat, and face: negative Respiratory: negative Cardiovascular: negative Gastrointestinal: negative Genitourinary:negative Integument/breast: negative Hematologic/lymphatic: negative Musculoskeletal:negative Neurological: negative Behavioral/Psych: negative Endocrine: negative Allergic/Immunologic: negative  Objective:  There were no vitals taken for this visit.   General:  {gen appearance:16600}   Breasts:  {desc; normal/abnormal/not indicated:14647}  Lungs: {lung exam:16931}  Heart:  {heart exam:5510}  Abdomen: {abdomen exam:16834}   Wound {Wound assessment:11097}  GU exam:  {desc; normal/abnormal/not indicated:14647}       Assessment:    There are no diagnoses linked to this encounter.  *** postpartum exam.   Plan:   Essential  components of care per ACOG recommendations:  1.  Mood and well being: Patient with {gen negative/positive:315881} depression screening today. Reviewed local resources for support.  - Patient tobacco use? {tobacco use:25506}  - hx of drug use? {yes/no:25505}    2. Infant care and feeding:  -Patient currently breastmilk feeding? {yes/no:25502}  -Social determinants of health (SDOH) reviewed in EPIC. No concerns***The following needs were identified***  3. Sexuality, contraception and birth spacing - Patient {DOES_DOES TOI:71245} want a pregnancy in the next year.  Desired family size is {NUMBER 1-10:22536} children.  - Reviewed forms of contraception in tiered fashion. Patient desired {PLAN CONTRACEPTION:313102} today.   - Discussed birth spacing of 18 months  4. Sleep and fatigue -Encouraged family/partner/community support of 4 hrs of uninterrupted sleep to help with mood and fatigue  5. Physical Recovery  - Discussed patients delivery and complications. She describes her labor as {description:25511} - Patient had a {CHL AMB [phone]}. Patient had a {laceration:25518} laceration. Perineal healing reviewed. Patient expressed understanding - Patient has urinary incontinence? {yes/no:25515} - Patient {ACTION; IS/IS YKD:[id number]} safe to resume physical and sexual activity  6.  Health Maintenance - HM due items addressed {Yes or If no, why not?:20788} - Last pap smear  Diagnosis  Date Value Ref Range Status  08/16/2018   Final   NEGATIVE FOR INTRAEPITHELIAL LESIONS OR MALIGNANCY.   Pap smear {done:10129} at today's visit.  -Breast Cancer screening indicated? {indicated:25516}  7. Chronic Disease/Pregnancy Condition follow up: {Follow up:25499}  - PCP follow up  Juergen Hardenbrook Emeline Darling, CMA [facility] [employer], [facility]

## 2021-03-11 NOTE — Progress Notes (Signed)
MY CHART TELEPHONE POSTPARTUM VISIT ENCOUNTER NOTE  I connected with Dorothy Olsen on 03/11/21 at  2:00 PM EDT by My Chart telephone at home and verified that I am speaking with the correct person using two identifiers. Patient unable to log on to MyChart video due to technical difficulties (could not access microphone or video through phone device after 3 attempts).   I discussed the limitations, risks, security and privacy concerns of performing an evaluation and management service by My Chart video and the availability of in person appointments. I also discussed with the patient that there may be a patient responsible charge related to this service. The patient expressed understanding and agreed to proceed.  Appointment Date: 03/11/2021  [facility] Clinic: [facility]  Chief Complaint:  Postpartum Visit  History of Present Illness: Dorothy Olsen is a 30 y.o. African-American [id number] (No LMP recorded.), seen for the above chief complaint. Her past medical history is significant for none.   She is s/p normal spontaneous vaginal delivery on 01/27/2021 at 37.1 weeks; she was discharged to home on PPD#1. Pregnancy complicated by ICP. Baby is doing well.  Complains of pelvic pain with extended walking  Vaginal bleeding or discharge: No  Mode of feeding infant: Breast Intercourse: No  Contraception: condoms PP depression s/s: No .  Any bowel or bladder issues: No  Pap smear: no abnormalities (date: 08/16/2018)  Review of Systems: Positive for pelvic pain. Her 12 point review of systems is negative or as noted in the History of Present Illness.  Patient Active Problem List   Diagnosis Date Noted   Alpha thalassemia silent carrier 01/27/2021   Indication for care in labor or delivery 01/26/2021   Cholestasis of pregnancy, antepartum 07/31/2020   Supervision of other normal pregnancy, antepartum 06/30/2020   Gestational thrombocytopenia (HCC) 11/12/2018    Medications Dorothy Olsen had  no medications administered during this visit. Current Outpatient Medications  Medication Sig Dispense Refill   acetaminophen (TYLENOL) 325 MG tablet Take 2 tablets (650 mg total) by mouth every 4 (four) hours as needed (for pain scale < 4). (Patient not taking: Reported on 03/11/2021)     coconut oil OIL Apply 1 application topically as needed. (Patient not taking: Reported on 03/11/2021)  0   ibuprofen (ADVIL) 600 MG tablet Take 1 tablet (600 mg total) by mouth every 6 (six) hours. (Patient not taking: Reported on 03/11/2021) 30 tablet 0   Prenatal Vit-Fe Fumarate-FA (PRENATAL VITAMINS) 28-0.8 MG TABS Take 1 tablet by mouth daily. (Patient not taking: Reported on 03/11/2021)     simethicone (MYLICON) 80 MG chewable tablet Chew 1 tablet (80 mg total) by mouth as needed for flatulence. (Patient not taking: Reported on 03/11/2021) 30 tablet 0   No current facility-administered medications for this visit.    Allergies Nickel  Physical Exam:  General:  Alert, oriented and cooperative.   Mental Status: Normal mood and affect perceived. Normal judgment and thought content.  Rest of physical exam deferred due to type of encounter  PP Depression Screening:    Edinburgh Postnatal Depression Scale - 03/11/21 1402       Edinburgh Postnatal Depression Scale:  In the Past 7 Days   I have been able to laugh and see the funny side of things. 0    I have looked forward with enjoyment to things. 0    I have blamed myself unnecessarily when things went wrong. 0    I have been anxious or worried for no good reason. 0  I have felt scared or panicky for no good reason. 0    Things have been getting on top of me. 0    I have been so unhappy that I have had difficulty sleeping. 0    I have felt sad or miserable. 0    I have been so unhappy that I have been crying. 0    The thought of harming myself has occurred to me. 0    Edinburgh Postnatal Depression Scale Total 0             Assessment:Patient  is a 30 y.o. K5G2563 who is 6 weeks postpartum from a normal spontaneous vaginal delivery.  She is doing well.   Plan: Encounter for postpartum care of lactating mother  - Normal PP visit  RTC March 2023  I discussed the assessment and treatment plan with the patient. The patient was provided an opportunity to ask questions and all were answered. The patient agreed with the plan and demonstrated an understanding of the instructions.   The patient was advised to call back or seek an in-person evaluation/go to the ED for any concerning postpartum symptoms.  I provided 10 minutes of non-face-to-face time during this encounter. There was 5 minutes of chart review time spent prior to this encounter. Total time spent = 15 minutes.    Dorothy Olsen, CNM [facility] [employer], [facility]

## 2022-05-24 IMAGING — US US MFM OB DETAIL+14 WK
1 series · 13 of 28 positions shown · non-contrast
Comparison: none

[Series 1: us mfm ob detail+14 wk · 97 acquisitions, 13 frames shown]
[im 4/97]
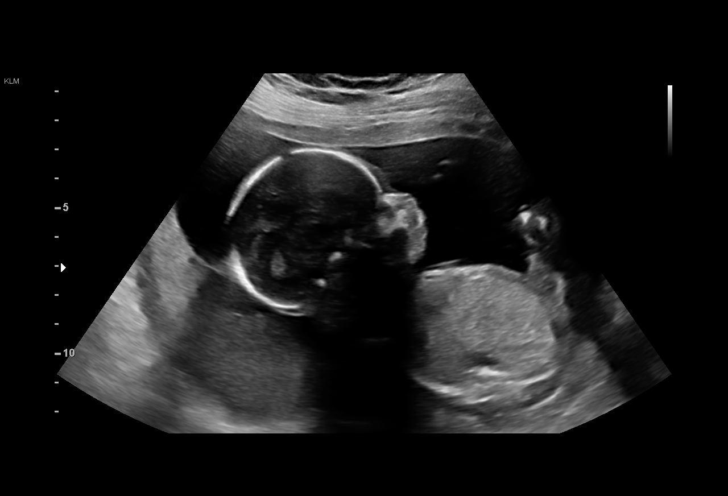
[im 11/97]
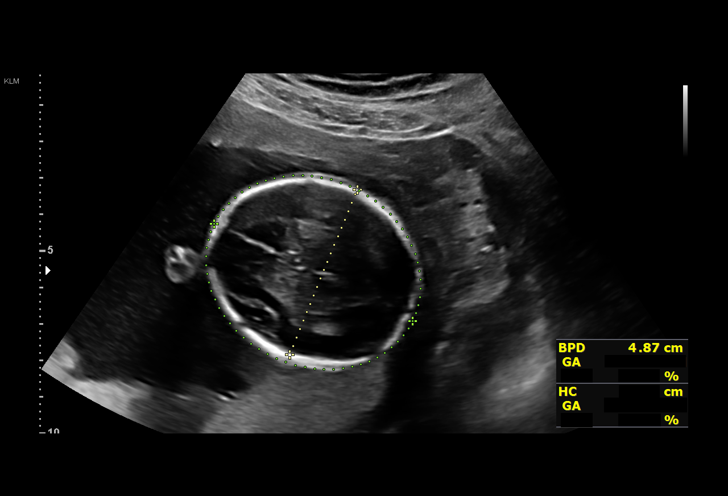
[im 18/97]
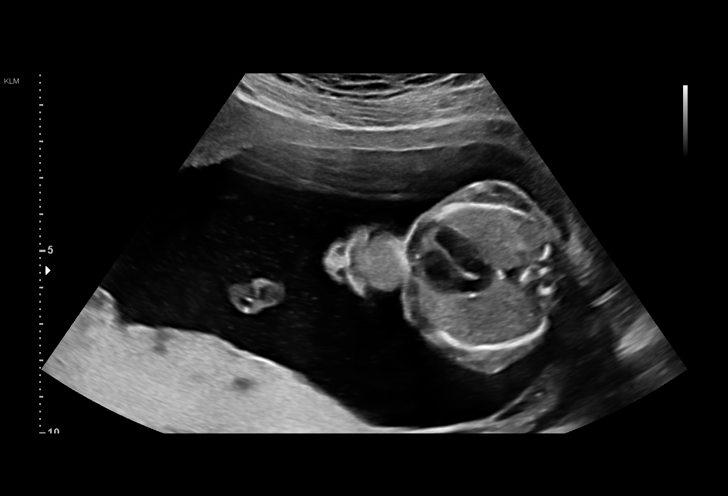
[im 25/97]
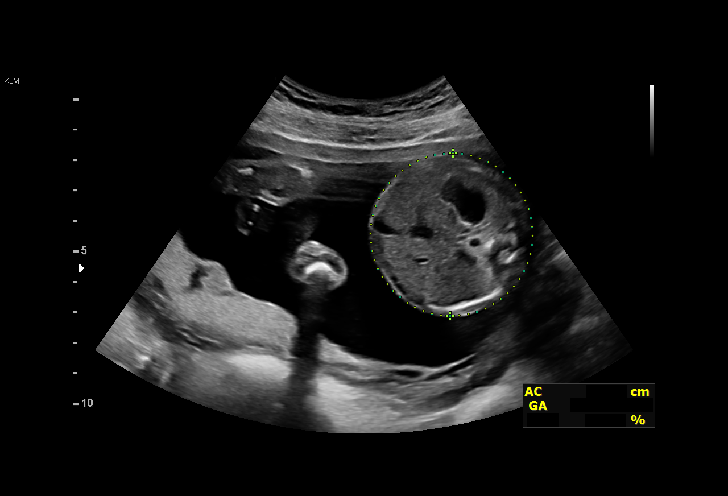
[im 33/97]
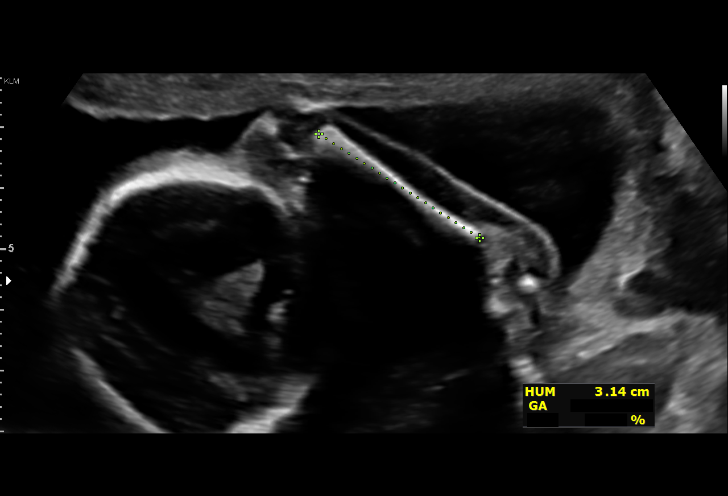
[im 40/97]
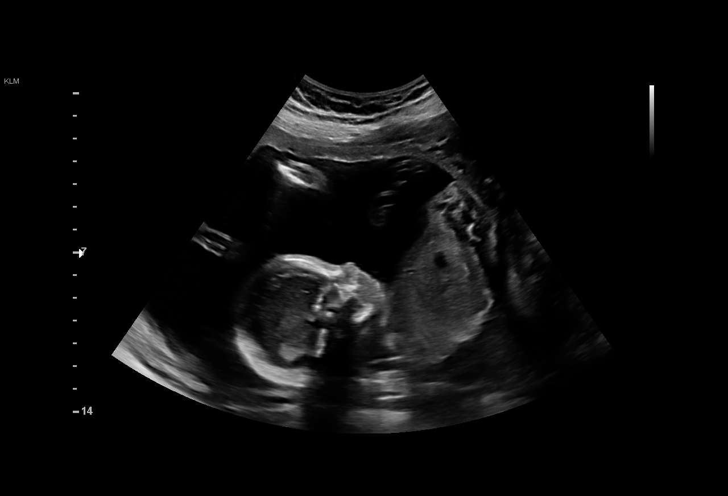
[im 50/97]
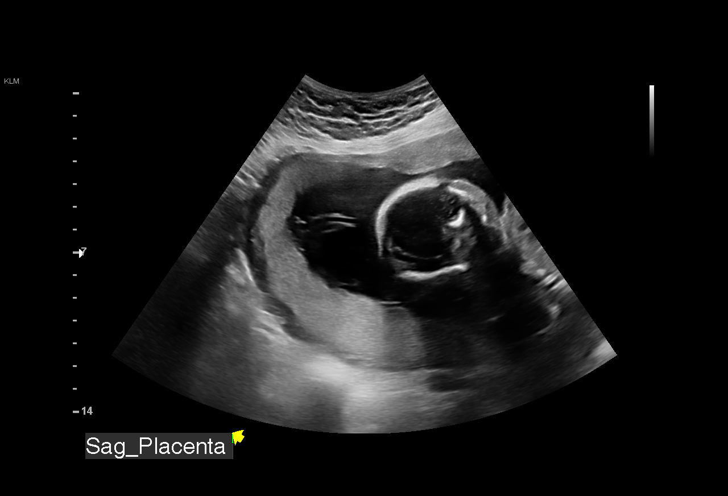
[im 57/97]
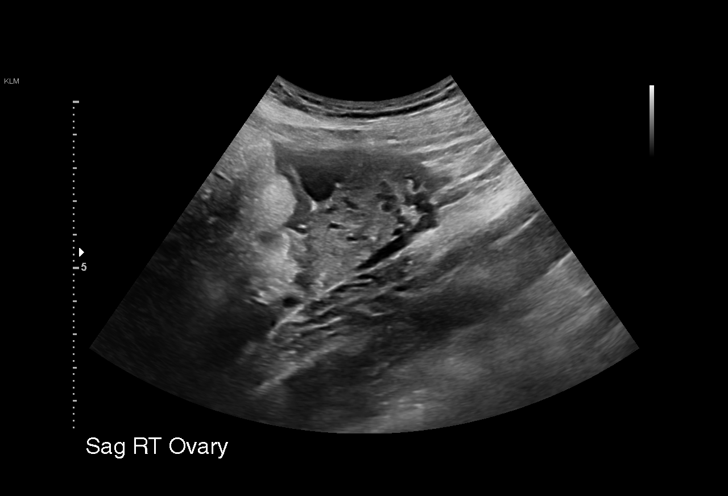
[im 65/97]
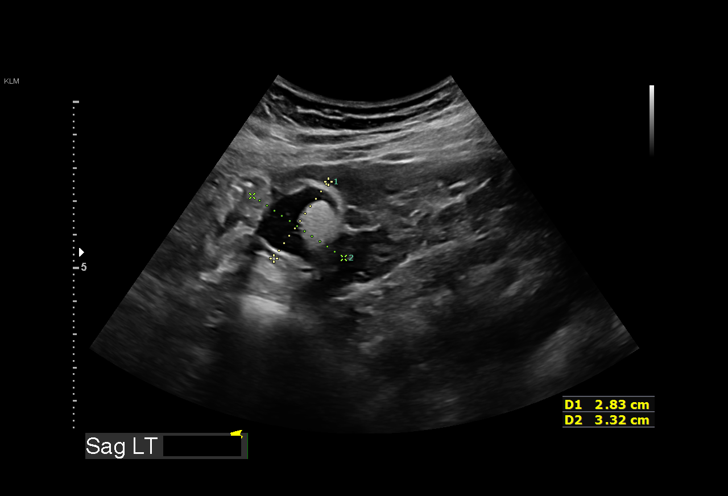
[im 72/97]
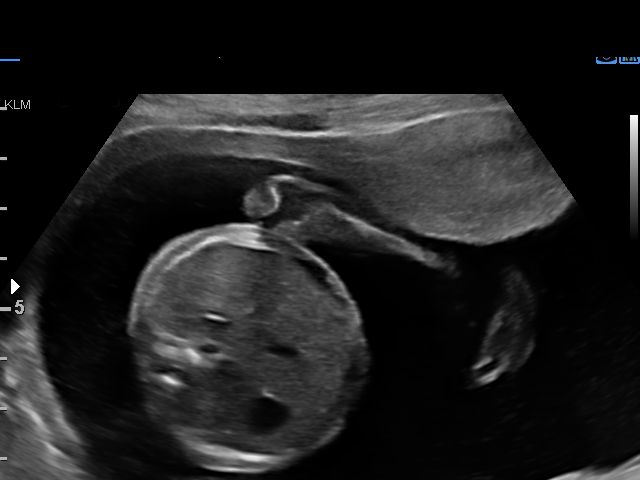
[im 79/97]
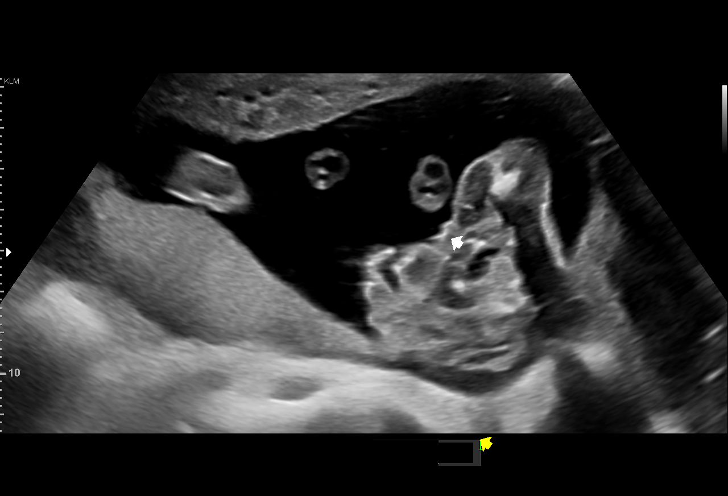
[im 86/97]
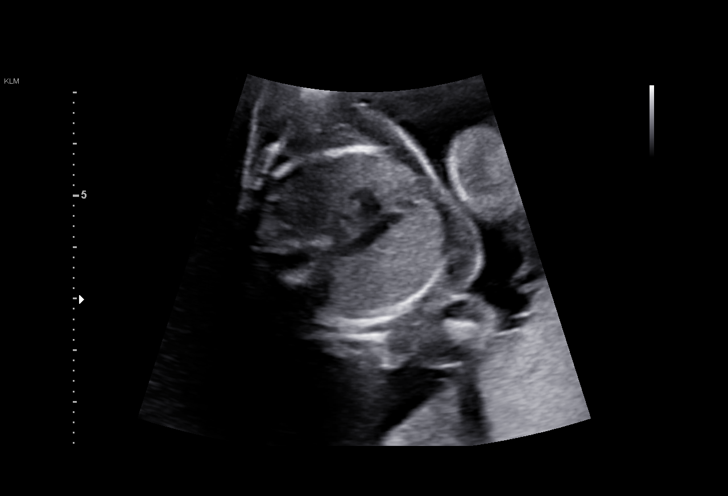
[im 93/97]
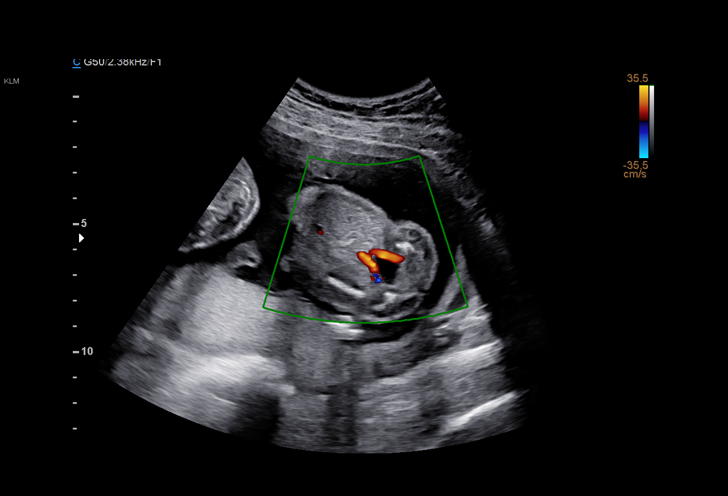

[13 of 28 positions shown; findings below may reference images not displayed]

Indications

 Cholestasis of pregnancy, second trimester     [id number]
 20 weeks gestation of pregnancy
 Encounter for antenatal screening for
 malformations (LR NIPS)
 Genetic carrier (Loe)
Fetal Evaluation

 Num Of Fetuses:         1
 Fetal Heart Rate(bpm):  135
 Cardiac Activity:       Observed
 Presentation:           Breech
 Placenta:               Posterior
 P. Cord Insertion:      Visualized

 Amniotic Fluid
 AFI FV:      Within normal limits
Biometry

 BPD:      48.1  mm     G. Age:  20w 4d         72  %    CI:        74.63   %    70 - 86
                                                         FL/HC:      17.9   %    16.8 -
 HC:      176.7  mm     G. Age:  20w 1d         50  %    HC/AC:      1.07        1.09 -
 AC:      164.5  mm     G. Age:  21w 4d         87  %    FL/BPD:     65.9   %
 FL:       31.7  mm     G. Age:  19w 6d         37  %    FL/AC:      19.3   %    20 - 24
 HUM:      31.9  mm     G. Age:  20w 5d         71  %
 CER:        21  mm     G. Age:  20w 0d         73  %

 Est. FW:     370  gm    0 lb 13 oz      82  %
OB History

 Gravidity:    2         Term:   1        Prem:   0        SAB:   0
 TOP:          0       Ectopic:  0        Living: 1
Gestational Age

 LMP:           20w 0d        Date:  05/12/20                 EDD:   02/16/21
 U/S Today:     20w 4d                                        EDD:   02/12/21
 Best:          20w 0d     Det. By:  LMP  (05/12/20)          EDD:   02/16/21
Anatomy

 Cranium:               Appears normal         Aortic Arch:            Not well visualized
 Cavum:                 Appears normal         Ductal Arch:            Not well visualized
 Ventricles:            Appears normal         Diaphragm:              Appears normal
 Choroid Plexus:        Appears normal         Stomach:                Appears normal, left
                                                                       sided
 Cerebellum:            Appears normal         Abdomen:                Appears normal
 Posterior Fossa:       Appears normal         Abdominal Wall:         Appears nml (cord
                                                                       insert, abd wall)
 Nuchal Fold:           Appears normal         Cord Vessels:           Appears normal (3
                                                                       vessel cord)
 Face:                  Appears normal         Kidneys:                Appear normal
                        (orbits and profile)
 Lips:                  Appears normal         Bladder:                Appears normal
 Thoracic:              Appears normal         Spine:                  Appears normal
 Heart:                 Appears normal         Upper Extremities:      Appears normal
                        (4CH, axis, and
                        situs)
 RVOT:                  Appears normal         Lower Extremities:      Appears normal
 LVOT:                  Not well visualized

 Other:  Fetus appears to be female. Heels and 5th digit visualized. 3VV and
         3VTV visualized. Technically difficult due to fetal position.
Cervix Uterus Adnexa

 Cervix
 Length:           3.97  cm.
 Normal appearance by transabdominal scan.

 Uterus
 No abnormality visualized.

 Right Ovary
 Within normal limits.

 Left Ovary
 Cystic teratoma 4.4x4.4x4.8cm

 Adnexa
 No abnormality visualized.
Comments

 This patient was seen for a detailed fetal anatomy scan due
 to cholestasis of pregnancy that is treated with Actigall 500
 mg twice a day.  The patient reports that she was screened
 for cholestasis of pregnancy again in this pregnancy as her
 last pregnancy was also complicated by cholestasis of
 pregnancy.  Her bile acids drawn in July 2020 was 20.
 She denies having any symptoms (itching) related to
 cholestasis of pregnancy.
 She denies any other significant past medical history and
 denies any problems in her current pregnancy.
 She had a cell free DNA test earlier in her pregnancy which
 indicated a low risk for trisomy 21, 18, and 13. A female fetus
 is predicted.
 She was informed that the fetal growth and amniotic fluid
 level were appropriate for her gestational age.
 There were no obvious fetal anomalies noted on today's
 ultrasound exam.  However, the views of the fetal anatomy
 were limited today due to the fetal position.
 The patient was informed that anomalies may be missed due
 to technical limitations. If the fetus is in a suboptimal position
 or maternal habitus is increased, visualization of the fetus in
 the maternal uterus may be impaired.
 The management of cholestasis of pregnancy was discussed
 today.  She was advised to continue taking Actigall as
 recommended.
 Due to the increased risk of an adverse fetal outcome
 associated with cholestasis of pregnancy, we will continue to
 follow her with monthly growth ultrasounds.  Weekly fetal
 testing should be started at around 32 weeks.  Delivery for
 women with cholestasis of pregnancy is recommended at
 around 37 weeks.
 A follow-up exam was scheduled in 4 weeks

## 2022-08-10 IMAGING — US US MFM FETAL BPP W/ NON-STRESS
1 series · 13 of 24 positions shown · non-contrast
Comparison: none

[Series 1: us mfm fetal bpp w/ non-stress · 24 acquisitions, 13 frames shown]
[im 1/24]
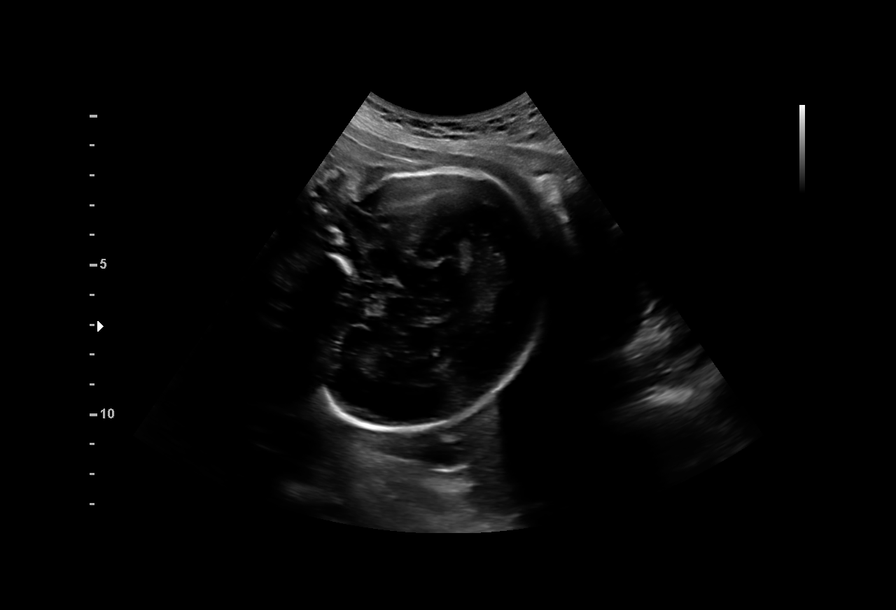
[im 3/24]
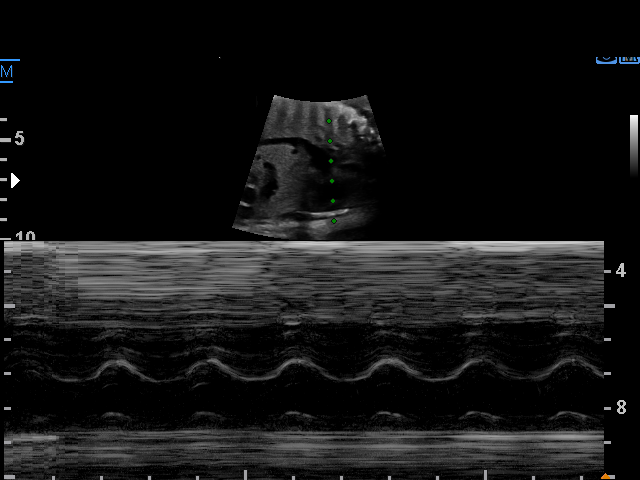
[im 5/24]
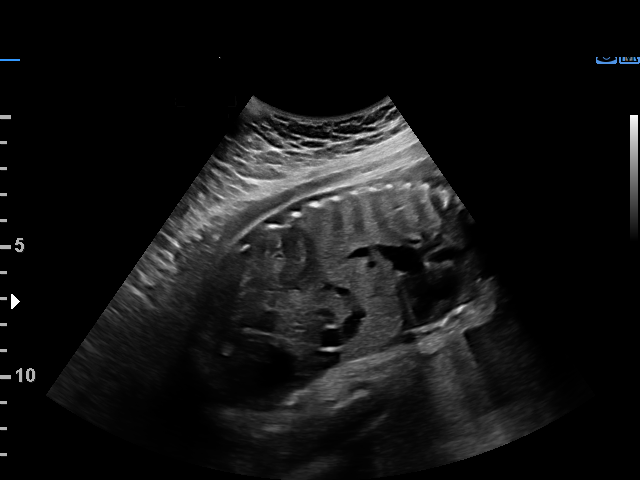
[im 7/24]
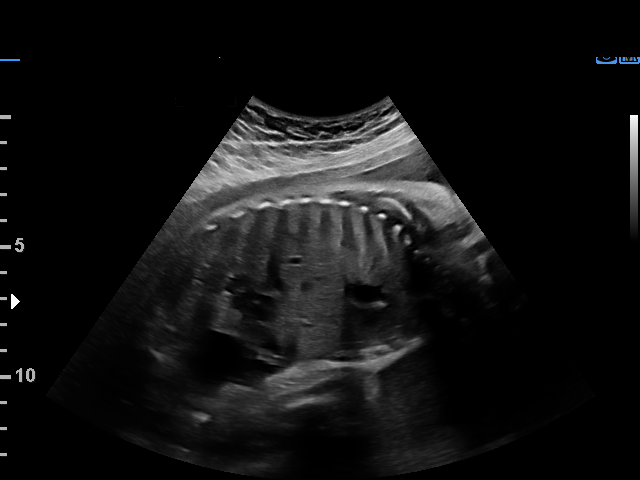
[im 9/24]
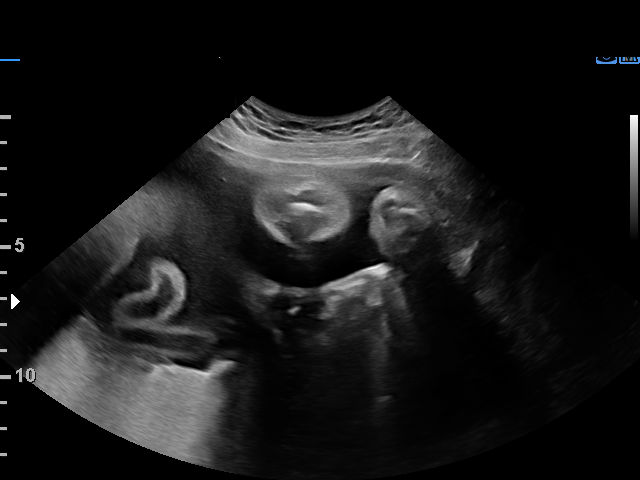
[im 11/24]
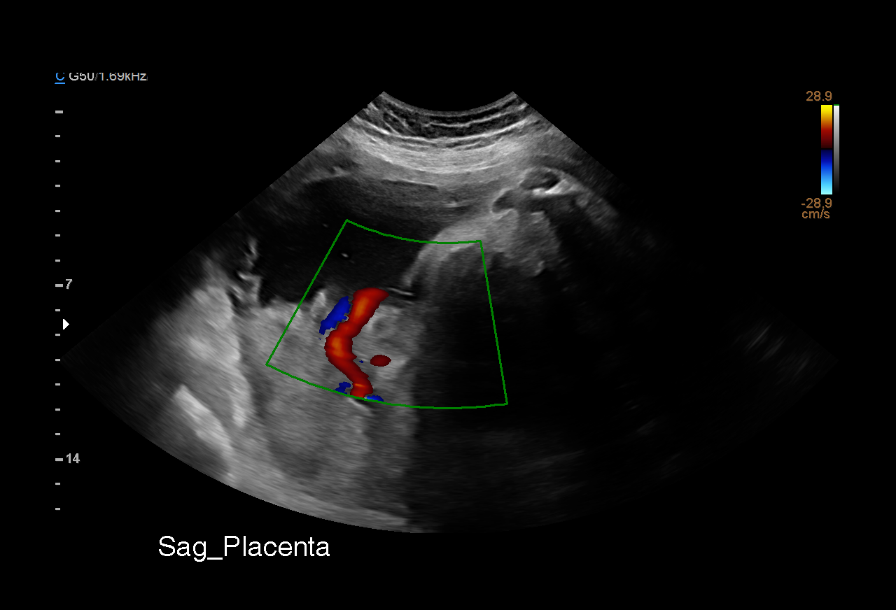
[im 13/24]
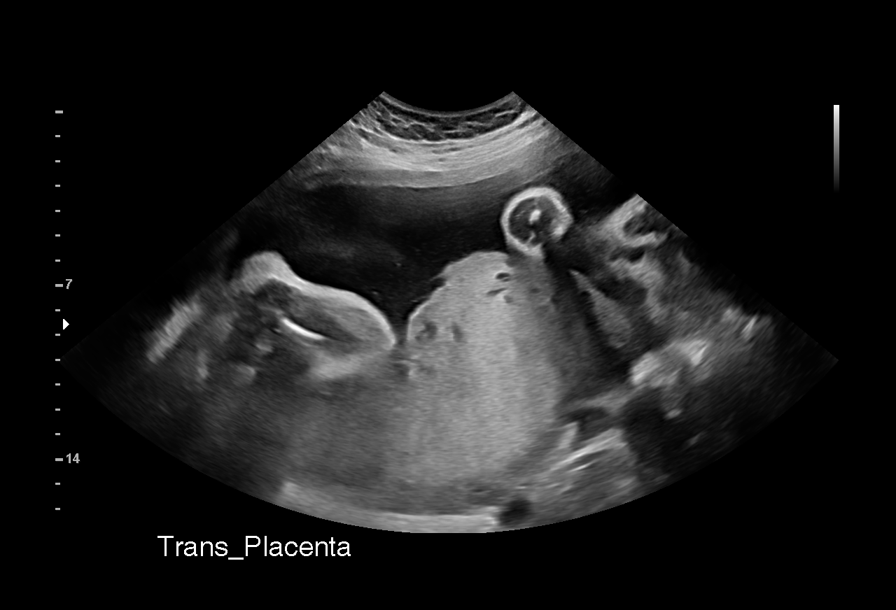
[im 14/24]
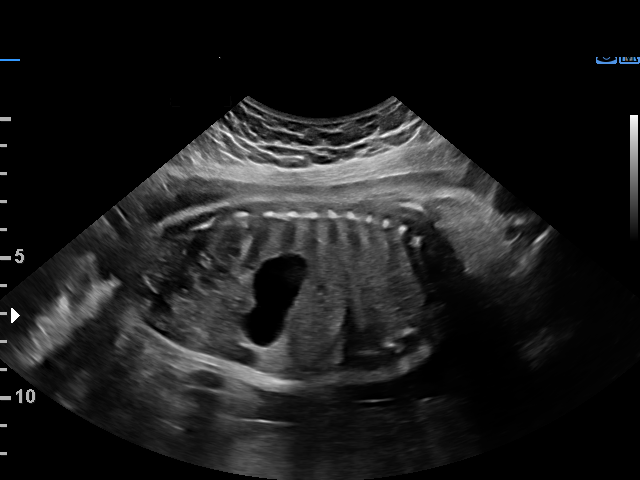
[im 16/24]
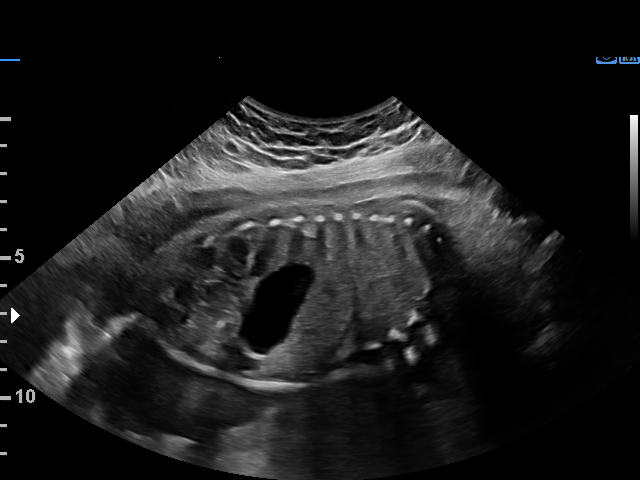
[im 18/24]
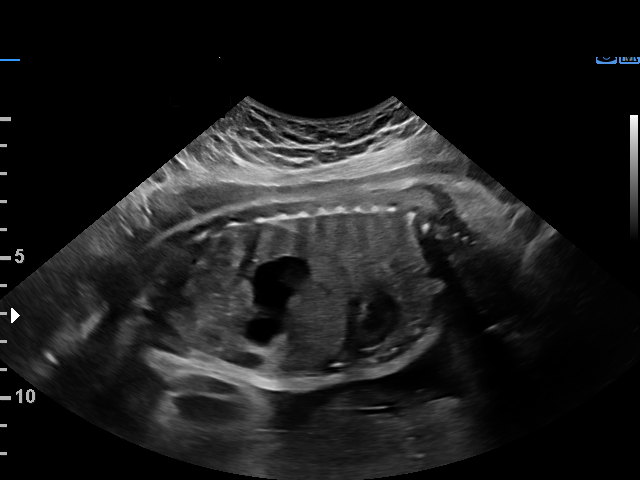
[im 20/24]
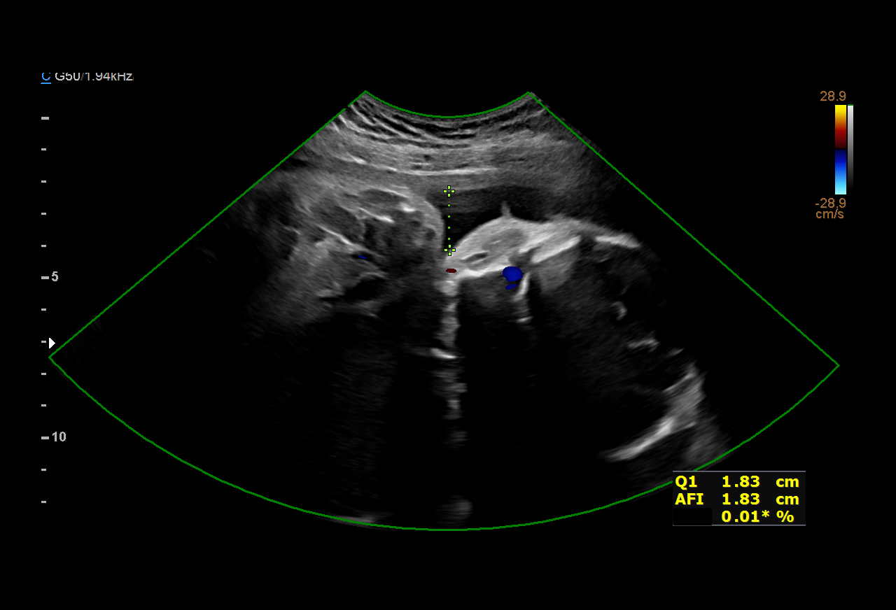
[im 22/24]
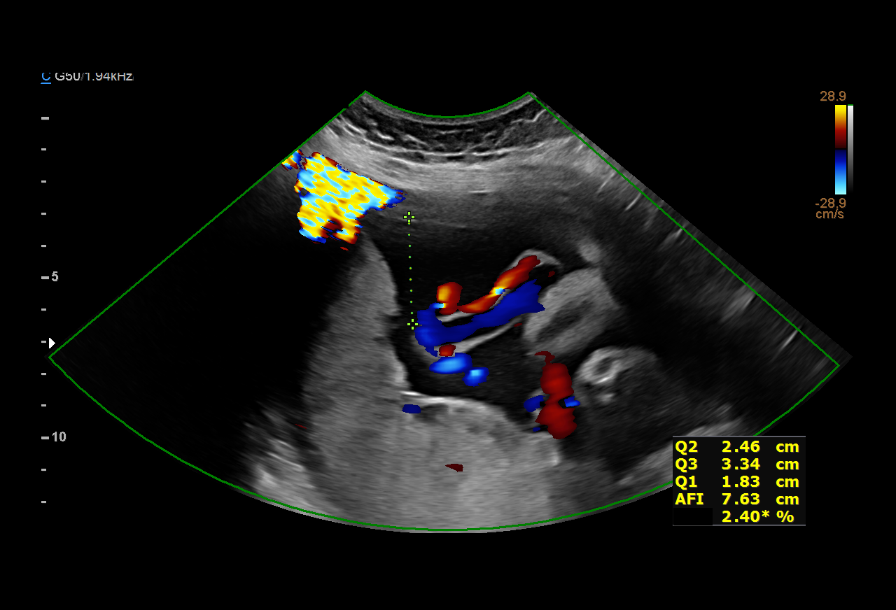
[im 24/24]
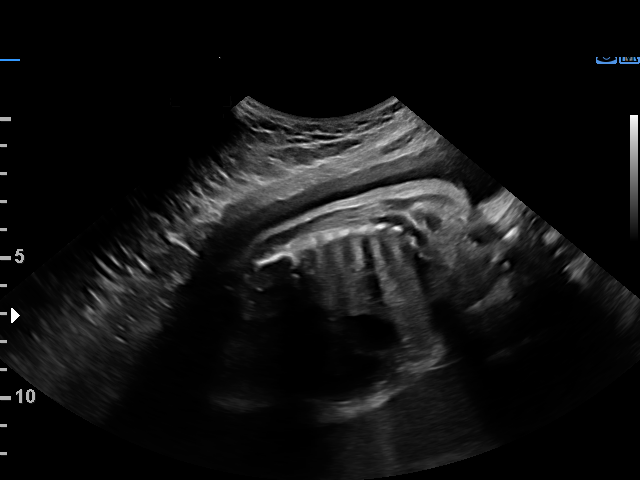

[13 of 24 positions shown; findings below may reference images not displayed]

W/NONSTRESS

Indications

 31 weeks gestation of pregnancy
 Cholestasis of pregnancy, third trimester      [id number]
 Genetic carrier (Christian Gonzalo)
 Low Risk NIPS(Negative AFP)
 Encounter for other antenatal screening
 follow-up
 28 weeks gestation of pregnancy
Fetal Evaluation

 Num Of Fetuses:         1
 Fetal Heart Rate(bpm):  158
 Cardiac Activity:       Observed
 Presentation:           Cephalic
 Placenta:               Posterior
 P. Cord Insertion:      Visualized

 Amniotic Fluid
 AFI FV:      Within normal limits

 AFI Sum(cm)     %Tile       Largest Pocket(cm)
 11.05           23

 RUQ(cm)       RLQ(cm)       LUQ(cm)        LLQ(cm)

Biophysical Evaluation

 Amniotic F.V:   Pocket => 2 cm             F. Tone:        Observed
 F. Movement:    Observed                   N.S.T:          Reactive
 F. Breathing:   Observed                   Score:          [DATE]
OB History

 Gravidity:    2         Term:   1        Prem:   0        SAB:   0
 TOP:          0       Ectopic:  0        Living: 1
Gestational Age

 LMP:           31w 1d        Date:  05/12/20                 EDD:   02/16/21
 Best:          31w 1d     Det. By:  LMP  (05/12/20)          EDD:   02/16/21
Impression

 Cholestasis in pregnancy.
 Amniotic fluid is decreased for this gestational age (no
 oligohydramnios) .Antenatal testing is reassuring. BPP [DATE].
Recommendations

 -Continue weekly BPP till delivery.
                 Rojas, Yefim

## 2022-09-07 IMAGING — US US MFM FETAL BPP W/ NON-STRESS
1 series · 12 of 22 positions shown · non-contrast
Comparison: none

[Series 1: us mfm fetal bpp w/ non-stress · 22 acquisitions, 12 frames shown]
[im 1/22]
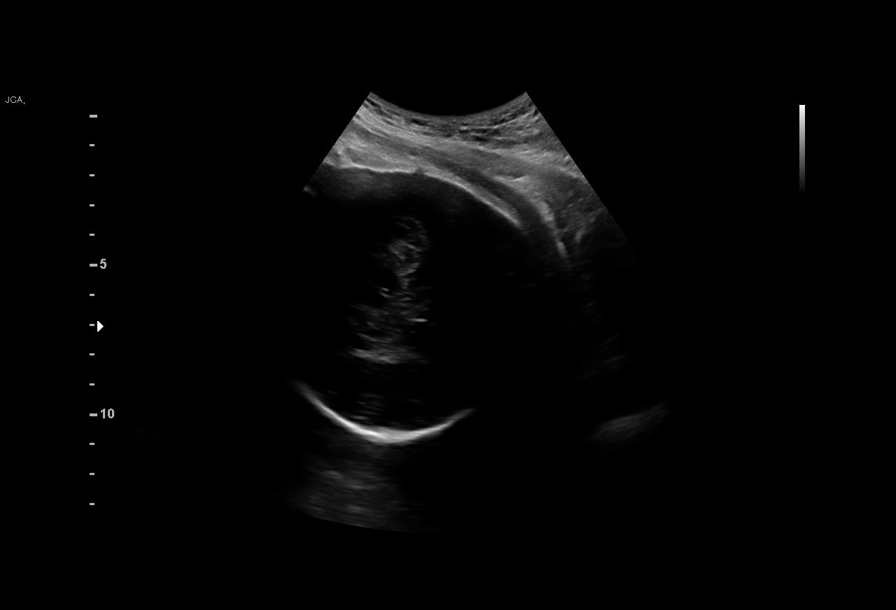
[im 3/22]
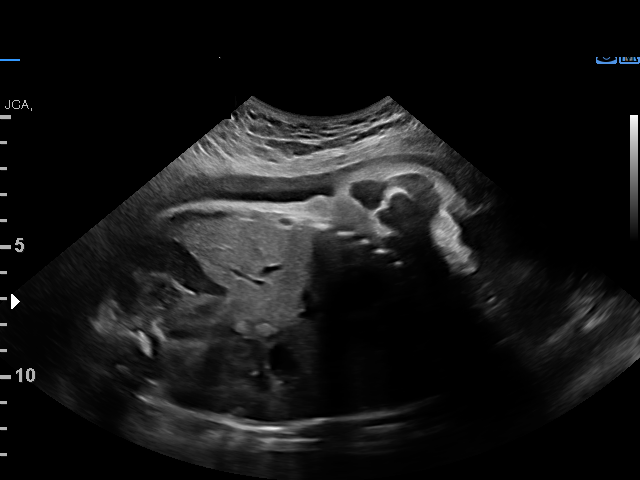
[im 5/22]
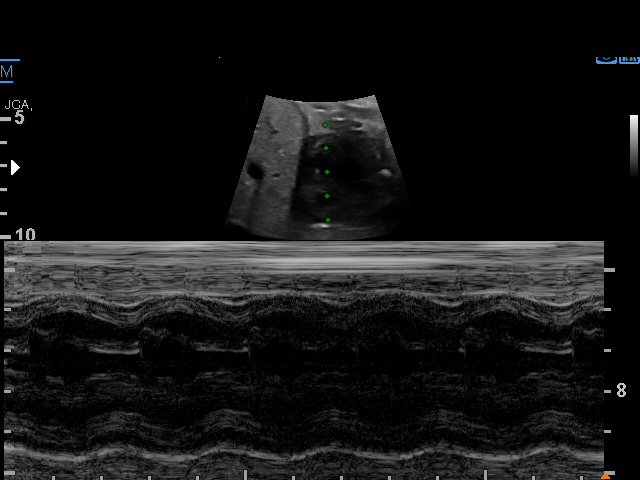
[im 7/22]
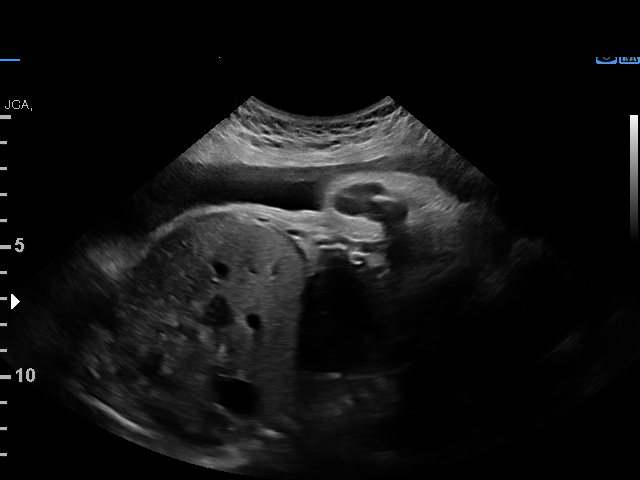
[im 9/22]
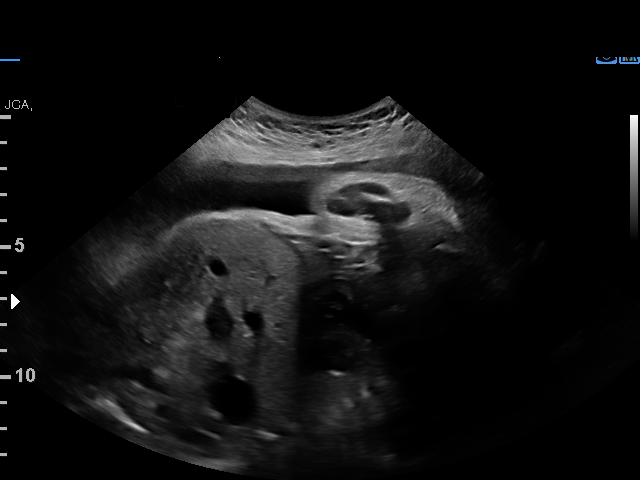
[im 11/22]
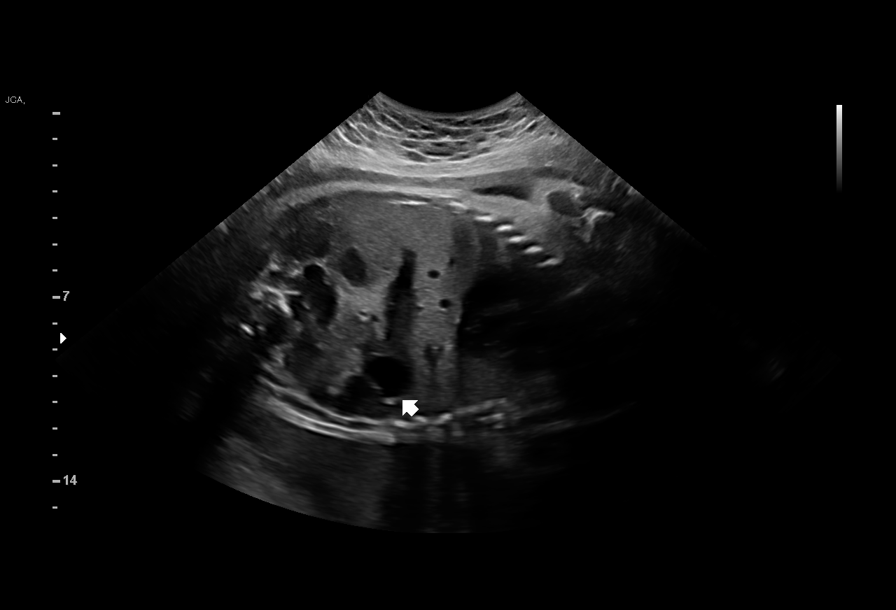
[im 12/22]
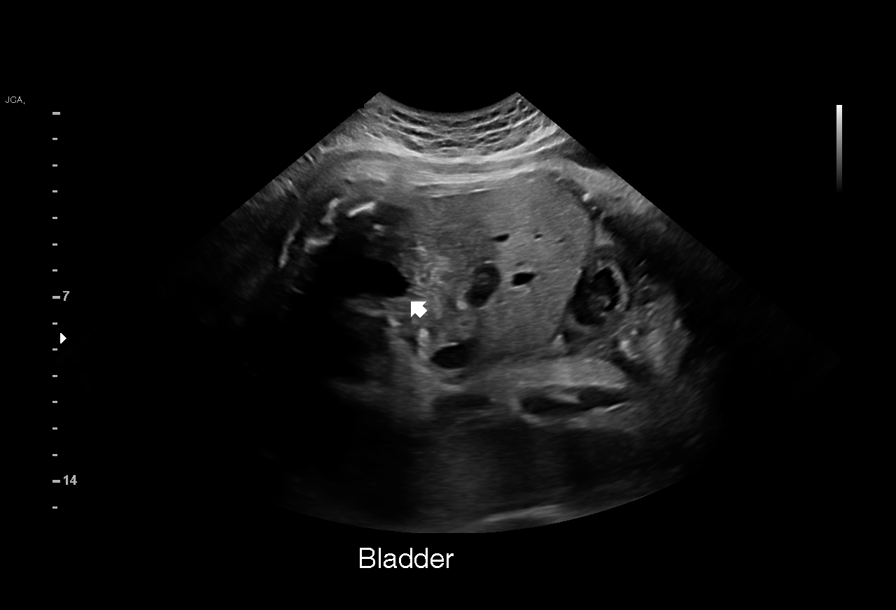
[im 14/22]
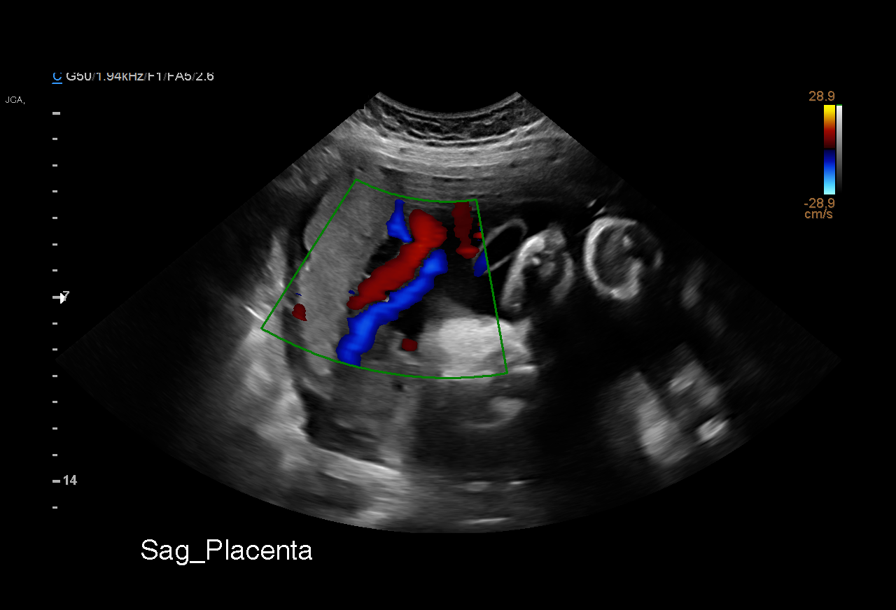
[im 16/22]
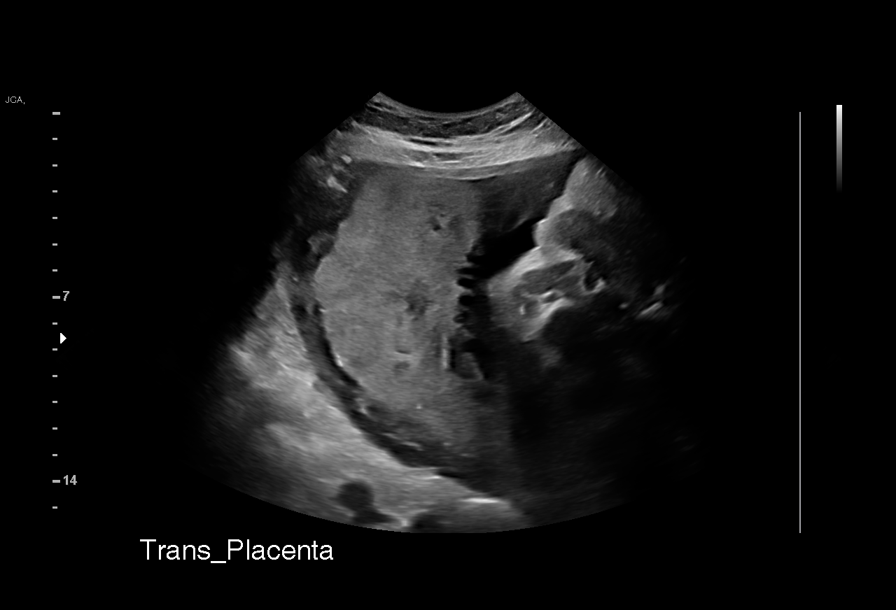
[im 18/22]
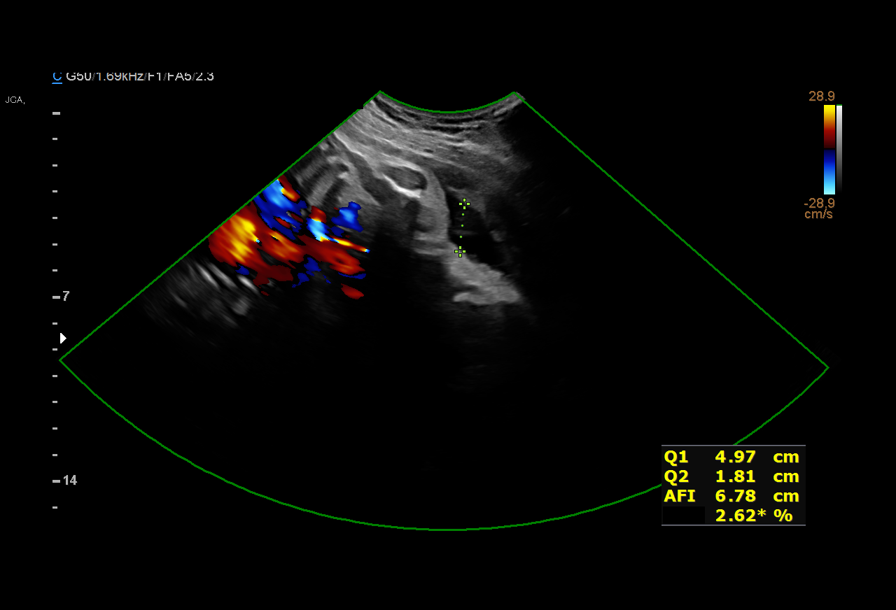
[im 20/22]
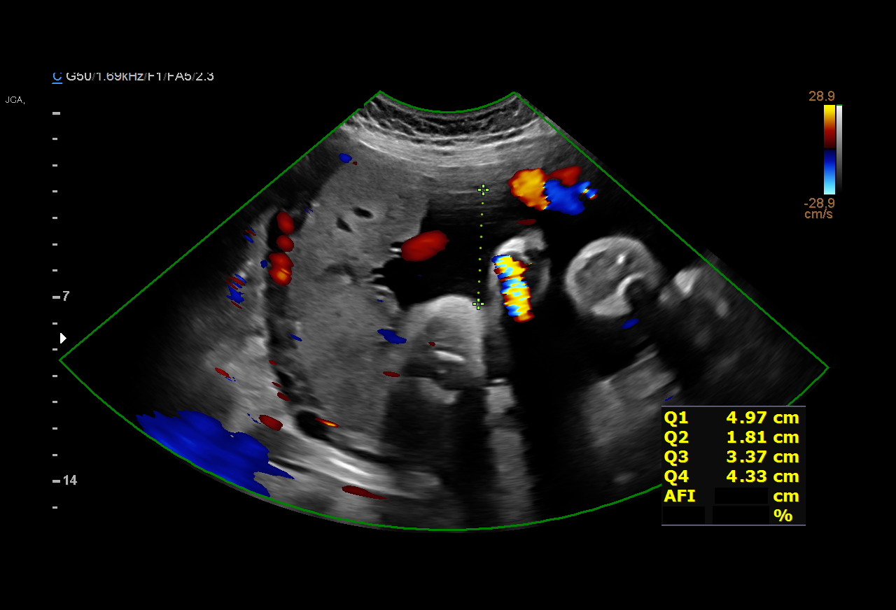
[im 22/22]
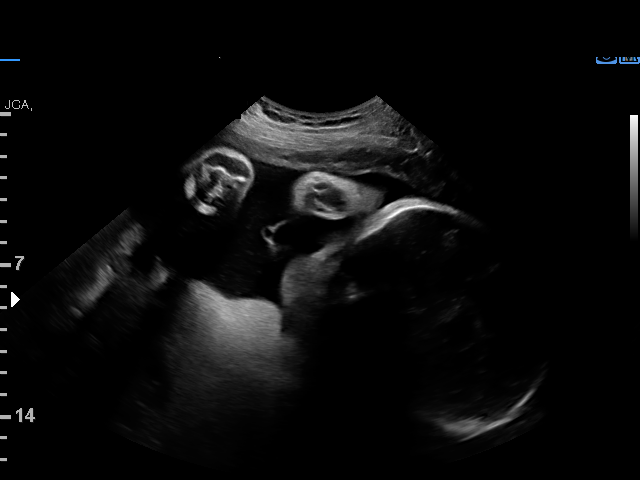

[12 of 22 positions shown; findings below may reference images not displayed]

W/NONSTRESS

Indications

 Cholestasis of pregnancy, third trimester      [id number]
 Genetic carrier (Null)
 35 weeks gestation of pregnancy
 Low Risk NIPS(Negative AFP)
Fetal Evaluation

 Num Of Fetuses:         1
 Fetal Heart Rate(bpm):  136
 Cardiac Activity:       Observed
 Presentation:           Cephalic
 Placenta:               Posterior
 P. Cord Insertion:      Visualized

 Amniotic Fluid
 AFI FV:      Within normal limits

 AFI Sum(cm)     %Tile       Largest Pocket(cm)
 14.48           52

 RUQ(cm)       RLQ(cm)       LUQ(cm)        LLQ(cm)

Biophysical Evaluation
 Amniotic F.V:   Pocket => 2 cm             F. Tone:        Observed
 F. Movement:    Observed                   N.S.T:          Reactive
 F. Breathing:   Observed                   Score:          [DATE]
OB History

 Gravidity:    2         Term:   1        Prem:   0        SAB:   0
 TOP:          0       Ectopic:  0        Living: 1
Gestational Age

 LMP:           35w 1d        Date:  05/12/20                 EDD:   02/16/21
 Best:          35w 1d     Det. By:  LMP  (05/12/20)          EDD:   02/16/21
Impression

 Cholestasis in pregnancy.  Patient return for antenatal testing.

 Amniotic fluid is normal good fetal activity seen.  Antenatal
 testing is reassuring.  NST is reactive.  BPP [DATE].
Recommendations

 -Fetal growth assessment, BPP and NST next week.
 -Delivery at 37 weeks' gestation.
                 Waring, Kendric
